# Patient Record
Sex: Female | Born: 1950 | Race: White | Hispanic: No | Marital: Married | Smoking: Never smoker
Health system: Southern US, Community
[De-identification: ages and names within clinical notes are randomized; demographics above are authoritative.]

## PROBLEM LIST (undated history)

## (undated) DIAGNOSIS — Z9889 Other specified postprocedural states: Secondary | ICD-10-CM

## (undated) DIAGNOSIS — K219 Gastro-esophageal reflux disease without esophagitis: Secondary | ICD-10-CM

## (undated) DIAGNOSIS — H269 Unspecified cataract: Secondary | ICD-10-CM

## (undated) DIAGNOSIS — M810 Age-related osteoporosis without current pathological fracture: Secondary | ICD-10-CM

## (undated) DIAGNOSIS — J00 Acute nasopharyngitis [common cold]: Secondary | ICD-10-CM

## (undated) DIAGNOSIS — R51 Headache: Secondary | ICD-10-CM

## (undated) DIAGNOSIS — T8859XA Other complications of anesthesia, initial encounter: Secondary | ICD-10-CM

## (undated) DIAGNOSIS — R112 Nausea with vomiting, unspecified: Secondary | ICD-10-CM

## (undated) DIAGNOSIS — J189 Pneumonia, unspecified organism: Secondary | ICD-10-CM

## (undated) DIAGNOSIS — M069 Rheumatoid arthritis, unspecified: Secondary | ICD-10-CM

## (undated) DIAGNOSIS — C4491 Basal cell carcinoma of skin, unspecified: Secondary | ICD-10-CM

## (undated) DIAGNOSIS — T7840XA Allergy, unspecified, initial encounter: Secondary | ICD-10-CM

## (undated) DIAGNOSIS — T4145XA Adverse effect of unspecified anesthetic, initial encounter: Secondary | ICD-10-CM

## (undated) DIAGNOSIS — R87619 Unspecified abnormal cytological findings in specimens from cervix uteri: Secondary | ICD-10-CM

## (undated) DIAGNOSIS — I10 Essential (primary) hypertension: Secondary | ICD-10-CM

## (undated) DIAGNOSIS — M1990 Unspecified osteoarthritis, unspecified site: Secondary | ICD-10-CM

## (undated) DIAGNOSIS — C801 Malignant (primary) neoplasm, unspecified: Secondary | ICD-10-CM

## (undated) HISTORY — DX: Unspecified abnormal cytological findings in specimens from cervix uteri: R87.619

## (undated) HISTORY — DX: Age-related osteoporosis without current pathological fracture: M81.0

## (undated) HISTORY — DX: Allergy, unspecified, initial encounter: T78.40XA

## (undated) HISTORY — DX: Unspecified cataract: H26.9

## (undated) HISTORY — DX: Rheumatoid arthritis, unspecified: M06.9

## (undated) HISTORY — DX: Malignant (primary) neoplasm, unspecified: C80.1

## (undated) HISTORY — PX: SKIN CANCER EXCISION: SHX779

## (undated) HISTORY — DX: Essential (primary) hypertension: I10

---
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
## 1982-10-22 HISTORY — PX: CRYOTHERAPY: SHX1416

## 1988-10-22 HISTORY — PX: DILATION AND CURETTAGE OF UTERUS: SHX78

## 1998-01-18 ENCOUNTER — Encounter (HOSPITAL_COMMUNITY): Admission: RE | Admit: 1998-01-18 | Discharge: 1998-04-18 | Payer: Self-pay

## 1998-04-27 ENCOUNTER — Encounter (HOSPITAL_COMMUNITY): Admission: RE | Admit: 1998-04-27 | Discharge: 1998-07-26 | Payer: Self-pay

## 1998-07-22 DIAGNOSIS — R87619 Unspecified abnormal cytological findings in specimens from cervix uteri: Secondary | ICD-10-CM

## 1998-07-22 HISTORY — DX: Unspecified abnormal cytological findings in specimens from cervix uteri: R87.619

## 1998-07-27 ENCOUNTER — Other Ambulatory Visit: Admission: RE | Admit: 1998-07-27 | Discharge: 1998-07-27 | Payer: Self-pay | Admitting: *Deleted

## 1998-09-07 ENCOUNTER — Other Ambulatory Visit: Admission: RE | Admit: 1998-09-07 | Discharge: 1998-09-07 | Payer: Self-pay | Admitting: *Deleted

## 1998-12-28 ENCOUNTER — Other Ambulatory Visit: Admission: RE | Admit: 1998-12-28 | Discharge: 1998-12-28 | Payer: Self-pay | Admitting: *Deleted

## 1999-08-23 ENCOUNTER — Other Ambulatory Visit: Admission: RE | Admit: 1999-08-23 | Discharge: 1999-08-23 | Payer: Self-pay | Admitting: *Deleted

## 2000-03-20 ENCOUNTER — Other Ambulatory Visit: Admission: RE | Admit: 2000-03-20 | Discharge: 2000-03-20 | Payer: Self-pay | Admitting: *Deleted

## 2000-08-26 ENCOUNTER — Other Ambulatory Visit: Admission: RE | Admit: 2000-08-26 | Discharge: 2000-08-26 | Payer: Self-pay | Admitting: *Deleted

## 2001-08-27 ENCOUNTER — Other Ambulatory Visit: Admission: RE | Admit: 2001-08-27 | Discharge: 2001-08-27 | Payer: Self-pay | Admitting: *Deleted

## 2002-08-20 ENCOUNTER — Other Ambulatory Visit: Admission: RE | Admit: 2002-08-20 | Discharge: 2002-08-20 | Payer: Self-pay | Admitting: *Deleted

## 2003-10-11 ENCOUNTER — Other Ambulatory Visit: Admission: RE | Admit: 2003-10-11 | Discharge: 2003-10-11 | Payer: Self-pay | Admitting: *Deleted

## 2004-10-12 ENCOUNTER — Other Ambulatory Visit: Admission: RE | Admit: 2004-10-12 | Discharge: 2004-10-12 | Payer: Self-pay | Admitting: *Deleted

## 2005-01-04 ENCOUNTER — Ambulatory Visit (HOSPITAL_COMMUNITY): Admission: RE | Admit: 2005-01-04 | Discharge: 2005-01-04 | Payer: Self-pay

## 2005-01-08 ENCOUNTER — Ambulatory Visit (HOSPITAL_COMMUNITY): Admission: RE | Admit: 2005-01-08 | Discharge: 2005-01-08 | Payer: Self-pay

## 2005-10-19 ENCOUNTER — Other Ambulatory Visit: Admission: RE | Admit: 2005-10-19 | Discharge: 2005-10-19 | Payer: Self-pay | Admitting: Obstetrics and Gynecology

## 2005-11-01 ENCOUNTER — Ambulatory Visit: Payer: Self-pay | Admitting: Gastroenterology

## 2005-11-28 ENCOUNTER — Ambulatory Visit: Payer: Self-pay | Admitting: Gastroenterology

## 2005-11-30 ENCOUNTER — Ambulatory Visit: Payer: Self-pay | Admitting: Gastroenterology

## 2006-11-21 ENCOUNTER — Other Ambulatory Visit: Admission: RE | Admit: 2006-11-21 | Discharge: 2006-11-21 | Payer: Self-pay | Admitting: Obstetrics and Gynecology

## 2007-12-09 ENCOUNTER — Other Ambulatory Visit: Admission: RE | Admit: 2007-12-09 | Discharge: 2007-12-09 | Payer: Self-pay | Admitting: Obstetrics and Gynecology

## 2008-12-13 ENCOUNTER — Other Ambulatory Visit: Admission: RE | Admit: 2008-12-13 | Discharge: 2008-12-13 | Payer: Self-pay | Admitting: Obstetrics and Gynecology

## 2013-10-21 NOTE — H&P (Signed)
History of Present Illness The patient is a 62 year old female who presents to the practice today for a transition into care. The patient is transitioning into care from another physician Sheran Luz, MD) .  Additional reason for visit:  Follow-up backis described as the following: The patient is being followed for their right-sided back pain. They are now 1 month(s) out. Symptoms reported today include: pain (and "I'm giving up and going ahead with surgery"), pain at night, difficulty ambulating, numbness (right leg, tingling), leg pain (right leg to foot) and pain with standing. The patient states that they are doing poorly. Current treatment includes: pain medications. The following medication has been used for pain control: none and antiinflammatory medication (Lodine bid for Rheumatology isssues). The patient reports their current pain level to be mild to moderate and 6 / 10. The patient presents today following ESI (08/24/13). The patient has reported improvement of their symptoms with: activity modification, conservative measures and Cortisone injections. The patient indicates that they have questions or concerns today regarding wants to discuss surgery , patient had second opinion with neurosurgeon , both described different procedures.    Subjective Transcription  The patient returns today for follow up. She continues to have significant back, buttock, and radicular neuropathic right leg pain. She is still getting some on the left, but the majority of her pain is right buttock and right leg pain. She has very little midline back pain. We have gone over her MRI and x rays again. Her MRI was from 03/05/13 and her x rays. She does have a slight anterior listhesis at L3-4 with some facet overgrowth and ligamentum flavum hypertrophy, but the majority of her stenosis at L4-5, especially on the right lateral recess. There is some disease at 5-1, but it is not as severe at the  4-5 level.    Allergies No Known Drug Allergies. 02/26/2013    Social History Alcohol use. current drinker; drinks wine; only occasionally per week Children. 1 Current work status. retired Financial planner (Currently). no Drug/Alcohol Rehab (Previously). no Exercise. Exercises daily; does running / walking and other Illicit drug use. no Living situation. live with spouse Marital status. married Number of flights of stairs before winded. 4-5 Pain Contract. no Tobacco use. Never smoker. never smoker    Medication History Ultram (50MG  Tablet, 1 (one) Tablet Oral four times daily, as needed, Taken starting 05/22/2013) Active. Vitamin D (Ergocalciferol) (50000UNIT Capsule, 1 Oral a week) Active. Oscal 500/200 D-3 (500-200MG -UNIT Tablet, 1 Oral two times daily) Active. Fish Oil (1000MG  Capsule, 1 Oral) Active. Methotrexate (2.5MG  Tablet, 8 Oral a week) Active. Etodolac (400MG  Tablet, 1 Oral two times daily, as needed) Active. Folic Acid (1MG  Tablet, 2 Oral daily) Active. Medications Reconciled.    Objective Transcription  At this point in time clinically she is alert and oriented times three. No shortness of breath or chest pain. The abdomen is soft and nontender. No incontinence of bowel and bladder. She has significant neuropathic leg pain on the right side. She has relief with forward flexion. Intensified pain with extension. Compartment are soft and nontender. Intact peripheral pulses throughout. No shortness of breath or chest pain. No incontinence of bowel and bladder. She has had multiple injections that have failed. She has had therapy and failed as well.  Radiographs:  She has a 7 degree curve from L2 to L4. There is no significant collapse of the disc and it is a relatively stable curve  MRI: Spinal stenosis multi-level with facet  arthrosis L3-5.  L4/5 right disc herniation vs spur causing marked lateral recess stenosis and L5  nerve compression   Assessment & Plan   Plans Transcription  At this point we have talked long and hard about fusion surgery versus decompression alone. Given the lack of any significant back pain, given the lack of any significant instability at the 4-5 level, I do not think instrumented fusion is warranted. My concern about doing a fusion at either 3-4, 4-5 or both levels is this would significant concentrate stress at the 5-1 level, which is not a good looking disc at this time. That is more likely to fail in the future and present more problems. Furthermore, the greatest amount of disease is at the 4-5 level and there is no instability at that level. There is some disease at 3-4 which can be taken care of with a laminotomy and fasciectomy. This can be done without destabilizing the 3-4 level. I have also indicated that if it were to destabilize and she were to have problems in the future, can always return and do a lateral based fusion operation. 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 The risks of that include infection, bleeding, nerve damage, death, stoke, paralysis, failure to heal, need for further surgery, ongoing or worse pain, loss of bowel and bladder control, blood clots, adjacent segment disease. All of her questions were addressed. We will plan on doing a 3 to 5 decompression. This would allow decompression of the facet arthrosis and ligamentum flavum buckling at 3-4 and the extensive stenosis at 4-5. We will also make sure we have clearance from her rheumatologist and her primary care physician and we will try to do this at some point in January after the holidays.

## 2013-10-28 ENCOUNTER — Encounter (HOSPITAL_COMMUNITY): Payer: Self-pay | Admitting: Pharmacy Technician

## 2013-11-03 NOTE — Pre-Procedure Instructions (Signed)
ZARA WENDT  11/03/2013   Your procedure is scheduled on:  Wednesday, Januray 21.  Report to [facility], Main Entrance [facility]" at 10:30 AM.  Call this number if you have problems the morning of surgery: [phone]   Remember:   Do not eat food or drink liquids after midnight Tuesday.   Take these medicines the morning of surgery with A SIP OF WATER: None.             Stop taking all Vitamins, Herbals Medications and NSAID's (Lodine).   Do not wear jewelry, make-up or nail polish.  Do not wear lotions, powders, or perfumes. You may wear deodorant.  Do not shave 48 hours prior to surgery.   Do not bring valuables to the hospital.  [facility] is not responsible for any belongings or valuables.               Contacts, dentures or bridgework may not be worn into surgery.  Leave suitcase in the car. After surgery it may be brought to your room.  For patients admitted to the hospital, discharge time is determined by your treatment team.               Patients discharged the day of surgery will not be allowed to drive home.  Name and phone number of your driver:-   Special Instructions: Shower using CHG 2 nights before surgery and the night before surgery.  If you shower the day of surgery use CHG.  Use special wash - you have one bottle of CHG for all showers.  You should use approximately 1/3 of the bottle for each shower.   Please read over the following fact sheets that you were given: Pain Booklet, Coughing and Deep Breathing and Surgical Site Infection Prevention

## 2013-11-04 ENCOUNTER — Encounter (HOSPITAL_COMMUNITY)
Admission: RE | Admit: 2013-11-04 | Discharge: 2013-11-04 | Disposition: A | Discharge status: Home / Self Care | Payer: BC Managed Care – PPO | Source: Ambulatory Visit | Attending: Orthopedic Surgery | Admitting: Orthopedic Surgery

## 2013-11-04 ENCOUNTER — Encounter (HOSPITAL_COMMUNITY): Payer: Self-pay

## 2013-11-04 DIAGNOSIS — Z01812 Encounter for preprocedural laboratory examination: Secondary | ICD-10-CM | POA: Insufficient documentation

## 2013-11-04 DIAGNOSIS — Z01818 Encounter for other preprocedural examination: Secondary | ICD-10-CM | POA: Insufficient documentation

## 2013-11-04 HISTORY — DX: Other complications of anesthesia, initial encounter: T88.59XA

## 2013-11-04 HISTORY — DX: Acute nasopharyngitis (common cold): J00

## 2013-11-04 HISTORY — DX: Headache: R51

## 2013-11-04 HISTORY — DX: Adverse effect of unspecified anesthetic, initial encounter: T41.45XA

## 2013-11-04 HISTORY — DX: Nausea with vomiting, unspecified: R11.2

## 2013-11-04 HISTORY — DX: Unspecified osteoarthritis, unspecified site: M19.90

## 2013-11-04 HISTORY — DX: Other specified postprocedural states: Z98.890

## 2013-11-04 LAB — BASIC METABOLIC PANEL
BUN: 24 mg/dL — ABNORMAL HIGH (ref 6–23)
CALCIUM: 9.8 mg/dL (ref 8.4–10.5)
CO2: 29 meq/L (ref 19–32)
CREATININE: 0.68 mg/dL (ref 0.50–1.10)
Chloride: 103 mEq/L (ref 96–112)
GFR calc Af Amer: >90 mL/min (ref >90)
GLUCOSE: 89 mg/dL (ref 70–99)
Potassium: 4.7 mEq/L (ref 3.7–5.3)
Sodium: 144 mEq/L (ref 137–147)

## 2013-11-04 LAB — SURGICAL PCR SCREEN
MRSA, PCR: NEGATIVE
STAPHYLOCOCCUS AUREUS: NEGATIVE

## 2013-11-04 LAB — CBC
HCT: 39.6 % (ref 36.0–46.0)
Hemoglobin: 13.5 g/dL (ref 12.0–15.0)
MCH: 31 pg (ref 26.0–34.0)
MCHC: 34.1 g/dL (ref 30.0–36.0)
MCV: 91 fL (ref 78.0–100.0)
PLATELETS: 165 10*3/uL (ref 150–400)
RBC: 4.35 MIL/uL (ref 3.87–5.11)
RDW: 13.4 % (ref 11.5–15.5)
WBC: 5.4 10*3/uL (ref 4.0–10.5)

## 2013-11-10 MED ORDER — CEFAZOLIN SODIUM-DEXTROSE 2-3 GM-% IV SOLR
2.0000 g | INTRAVENOUS | Status: AC
  Administered 2013-11-11: 2 g via INTRAVENOUS

## 2013-11-10 NOTE — Progress Notes (Signed)
Spoke with patient and informed her of surgery time change and to arrive at 0630 with stated understanding.

## 2013-11-11 ENCOUNTER — Encounter (HOSPITAL_COMMUNITY): Payer: BC Managed Care – PPO | Admitting: Anesthesiology

## 2013-11-11 ENCOUNTER — Encounter (HOSPITAL_COMMUNITY): Payer: Self-pay | Admitting: *Deleted

## 2013-11-11 ENCOUNTER — Inpatient Hospital Stay (HOSPITAL_COMMUNITY): Payer: BC Managed Care – PPO | Admitting: Anesthesiology

## 2013-11-11 ENCOUNTER — Inpatient Hospital Stay (HOSPITAL_COMMUNITY): Payer: BC Managed Care – PPO

## 2013-11-11 ENCOUNTER — Inpatient Hospital Stay (HOSPITAL_COMMUNITY)
Admission: RE | Admit: 2013-11-11 | Discharge: 2013-11-13 | DRG: 520 | Disposition: A | Discharge status: Home Health | Payer: BC Managed Care – PPO | Source: Ambulatory Visit | Attending: Orthopedic Surgery | Admitting: Orthopedic Surgery

## 2013-11-11 ENCOUNTER — Encounter (HOSPITAL_COMMUNITY)
Admission: RE | Disposition: A | Discharge status: Home Health | Payer: BC Managed Care – PPO | Source: Ambulatory Visit | Attending: Orthopedic Surgery

## 2013-11-11 DIAGNOSIS — Q762 Congenital spondylolisthesis: Secondary | ICD-10-CM

## 2013-11-11 DIAGNOSIS — M5126 Other intervertebral disc displacement, lumbar region: Principal | ICD-10-CM | POA: Diagnosis present

## 2013-11-11 DIAGNOSIS — M549 Dorsalgia, unspecified: Secondary | ICD-10-CM | POA: Diagnosis present

## 2013-11-11 DIAGNOSIS — Z9889 Other specified postprocedural states: Secondary | ICD-10-CM

## 2013-11-11 DIAGNOSIS — M069 Rheumatoid arthritis, unspecified: Secondary | ICD-10-CM | POA: Diagnosis present

## 2013-11-11 HISTORY — PX: DECOMPRESSIVE LUMBAR LAMINECTOMY LEVEL 2: SHX5792

## 2013-11-11 HISTORY — PX: LUMBAR LAMINECTOMY: SHX95

## 2013-11-11 SURGERY — DECOMPRESSIVE LUMBAR LAMINECTOMY LEVEL 2
Anesthesia: General

## 2013-11-11 MED ORDER — MENTHOL 3 MG MT LOZG: 1.0000 | LOZENGE | OROMUCOSAL | Status: DC | PRN

## 2013-11-11 MED ORDER — METHOCARBAMOL 500 MG PO TABS
ORAL_TABLET | ORAL | Status: AC
  Administered 2013-11-11: 500 mg via ORAL
  Filled 2013-11-11: qty 1

## 2013-11-11 MED ORDER — NEOSTIGMINE METHYLSULFATE 1 MG/ML IJ SOLN
INTRAMUSCULAR | Status: DC | PRN
  Administered 2013-11-11: 3 mg via INTRAVENOUS

## 2013-11-11 MED ORDER — PHENOL 1.4 % MT LIQD: 1.0000 | OROMUCOSAL | Status: DC | PRN

## 2013-11-11 MED ORDER — PHENYLEPHRINE 40 MCG/ML (10ML) SYRINGE FOR IV PUSH (FOR BLOOD PRESSURE SUPPORT)
PREFILLED_SYRINGE | INTRAVENOUS | Status: AC
  Filled 2013-11-11: qty 20

## 2013-11-11 MED ORDER — MIDAZOLAM HCL 2 MG/2ML IJ SOLN: 1.0000 mg | INTRAMUSCULAR | Status: DC | PRN

## 2013-11-11 MED ORDER — SODIUM CHLORIDE 0.9 % IJ SOLN: 3.0000 mL | INTRAMUSCULAR | Status: DC | PRN

## 2013-11-11 MED ORDER — PROMETHAZINE HCL 25 MG/ML IJ SOLN: 6.2500 mg | INTRAMUSCULAR | Status: DC | PRN

## 2013-11-11 MED ORDER — HYDROMORPHONE HCL PF 1 MG/ML IJ SOLN
INTRAMUSCULAR | Status: AC
  Administered 2013-11-11: 0.5 mg via INTRAVENOUS
  Filled 2013-11-11: qty 1

## 2013-11-11 MED ORDER — LIDOCAINE HCL (CARDIAC) 20 MG/ML IV SOLN
INTRAVENOUS | Status: DC | PRN
  Administered 2013-11-11: 80 mg via INTRAVENOUS

## 2013-11-11 MED ORDER — PHENYLEPHRINE HCL 10 MG/ML IJ SOLN
INTRAMUSCULAR | Status: AC
  Filled 2013-11-11: qty 1

## 2013-11-11 MED ORDER — ARTIFICIAL TEARS OP OINT
TOPICAL_OINTMENT | OPHTHALMIC | Status: AC
  Filled 2013-11-11: qty 3.5

## 2013-11-11 MED ORDER — OXYCODONE HCL 5 MG PO TABS
5.0000 mg | ORAL_TABLET | ORAL | Status: DC | PRN
Start: 2013-11-11 — End: 2013-11-13

## 2013-11-11 MED ORDER — PHENYLEPHRINE HCL 10 MG/ML IJ SOLN
10.0000 mg | INTRAMUSCULAR | Status: DC | PRN
  Administered 2013-11-11: 40 ug/min via INTRAVENOUS

## 2013-11-11 MED ORDER — ACETAMINOPHEN 10 MG/ML IV SOLN
1000.0000 mg | Freq: Four times a day (QID) | INTRAVENOUS | Status: DC
  Administered 2013-11-11: 1000 mg via INTRAVENOUS
  Filled 2013-11-11: qty 100

## 2013-11-11 MED ORDER — DEXAMETHASONE SODIUM PHOSPHATE 10 MG/ML IJ SOLN
INTRAMUSCULAR | Status: DC | PRN
  Administered 2013-11-11: 10 mg via INTRAVENOUS

## 2013-11-11 MED ORDER — PHENYLEPHRINE HCL 10 MG/ML IJ SOLN
INTRAMUSCULAR | Status: DC | PRN
  Administered 2013-11-11 (×4): 80 ug via INTRAVENOUS

## 2013-11-11 MED ORDER — TRAMADOL HCL 50 MG PO TABS
50.0000 mg | ORAL_TABLET | Freq: Four times a day (QID) | ORAL | Status: DC | PRN
  Administered 2013-11-12 – 2013-11-13 (×5): 50 mg via ORAL
  Filled 2013-11-11 (×5): qty 1

## 2013-11-11 MED ORDER — THROMBIN 20000 UNITS EX SOLR
CUTANEOUS | Status: AC
  Filled 2013-11-11: qty 20000

## 2013-11-11 MED ORDER — MORPHINE SULFATE 2 MG/ML IJ SOLN
INTRAMUSCULAR | Status: AC
  Filled 2013-11-11: qty 1

## 2013-11-11 MED ORDER — ONDANSETRON HCL 4 MG/2ML IJ SOLN
INTRAMUSCULAR | Status: DC | PRN
Start: 2013-11-11 — End: 2013-11-11
  Administered 2013-11-11: 4 mg via INTRAVENOUS

## 2013-11-11 MED ORDER — DEXAMETHASONE SODIUM PHOSPHATE 10 MG/ML IJ SOLN
INTRAMUSCULAR | Status: AC
  Filled 2013-11-11: qty 1

## 2013-11-11 MED ORDER — BUPIVACAINE-EPINEPHRINE PF 0.25-1:200000 % IJ SOLN
INTRAMUSCULAR | Status: DC | PRN
  Administered 2013-11-11: 30 mL via PERINEURAL

## 2013-11-11 MED ORDER — FENTANYL CITRATE 0.05 MG/ML IJ SOLN
INTRAMUSCULAR | Status: AC
  Filled 2013-11-11: qty 5

## 2013-11-11 MED ORDER — NEOSTIGMINE METHYLSULFATE 1 MG/ML IJ SOLN
INTRAMUSCULAR | Status: AC
  Filled 2013-11-11: qty 10

## 2013-11-11 MED ORDER — EPHEDRINE SULFATE 50 MG/ML IJ SOLN
INTRAMUSCULAR | Status: AC
  Filled 2013-11-11: qty 1

## 2013-11-11 MED ORDER — FENTANYL CITRATE 0.05 MG/ML IJ SOLN
INTRAMUSCULAR | Status: DC | PRN
  Administered 2013-11-11: 50 ug via INTRAVENOUS
  Administered 2013-11-11: 100 ug via INTRAVENOUS

## 2013-11-11 MED ORDER — ONDANSETRON HCL 4 MG/2ML IJ SOLN
4.0000 mg | INTRAMUSCULAR | Status: DC | PRN
  Administered 2013-11-11 (×2): 4 mg via INTRAVENOUS
  Filled 2013-11-11 (×2): qty 2

## 2013-11-11 MED ORDER — ROCURONIUM BROMIDE 50 MG/5ML IV SOLN
INTRAVENOUS | Status: AC
  Filled 2013-11-11: qty 1

## 2013-11-11 MED ORDER — OXYCODONE HCL 5 MG PO TABS
ORAL_TABLET | ORAL | Status: AC
  Administered 2013-11-11: 5 mg via ORAL
  Filled 2013-11-11: qty 1

## 2013-11-11 MED ORDER — SODIUM CHLORIDE 0.9 % IJ SOLN
3.0000 mL | Freq: Two times a day (BID) | INTRAMUSCULAR | Status: DC
  Administered 2013-11-12: 3 mL via INTRAVENOUS

## 2013-11-11 MED ORDER — MORPHINE SULFATE 2 MG/ML IJ SOLN
1.0000 mg | INTRAMUSCULAR | Status: DC | PRN
  Administered 2013-11-11 – 2013-11-12 (×5): 2 mg via INTRAVENOUS
  Filled 2013-11-11 (×4): qty 1

## 2013-11-11 MED ORDER — STERILE WATER FOR INJECTION IJ SOLN
INTRAMUSCULAR | Status: AC
  Filled 2013-11-11: qty 10

## 2013-11-11 MED ORDER — ONDANSETRON HCL 4 MG/2ML IJ SOLN
INTRAMUSCULAR | Status: AC
  Filled 2013-11-11: qty 2

## 2013-11-11 MED ORDER — OXYCODONE HCL 5 MG/5ML PO SOLN: 5.0000 mg | Freq: Once | ORAL | Status: AC | PRN

## 2013-11-11 MED ORDER — PROPOFOL 10 MG/ML IV BOLUS
INTRAVENOUS | Status: AC
  Filled 2013-11-11: qty 20

## 2013-11-11 MED ORDER — LACTATED RINGERS IV SOLN
INTRAVENOUS | Status: DC | PRN
  Administered 2013-11-11 (×2): via INTRAVENOUS

## 2013-11-11 MED ORDER — SODIUM CHLORIDE 0.9 % IV SOLN: 250.0000 mL | INTRAVENOUS | Status: DC

## 2013-11-11 MED ORDER — DEXAMETHASONE 4 MG PO TABS
4.0000 mg | ORAL_TABLET | Freq: Four times a day (QID) | ORAL | Status: DC
  Administered 2013-11-12 – 2013-11-13 (×5): 4 mg via ORAL
  Filled 2013-11-11 (×11): qty 1

## 2013-11-11 MED ORDER — FENTANYL CITRATE 0.05 MG/ML IJ SOLN: 50.0000 ug | Freq: Once | INTRAMUSCULAR | Status: DC

## 2013-11-11 MED ORDER — DEXAMETHASONE SODIUM PHOSPHATE 4 MG/ML IJ SOLN
4.0000 mg | Freq: Four times a day (QID) | INTRAMUSCULAR | Status: DC
  Administered 2013-11-11 – 2013-11-12 (×3): 4 mg via INTRAVENOUS
  Filled 2013-11-11 (×11): qty 1

## 2013-11-11 MED ORDER — VECURONIUM BROMIDE 10 MG IV SOLR
INTRAVENOUS | Status: AC
  Filled 2013-11-11: qty 10

## 2013-11-11 MED ORDER — PROPOFOL 10 MG/ML IV BOLUS
INTRAVENOUS | Status: DC | PRN
  Administered 2013-11-11: 150 mg via INTRAVENOUS

## 2013-11-11 MED ORDER — GLYCOPYRROLATE 0.2 MG/ML IJ SOLN
INTRAMUSCULAR | Status: AC
  Filled 2013-11-11: qty 1

## 2013-11-11 MED ORDER — OXYCODONE HCL 5 MG PO TABS
5.0000 mg | ORAL_TABLET | Freq: Once | ORAL | Status: AC | PRN
  Administered 2013-11-11: 5 mg via ORAL

## 2013-11-11 MED ORDER — MIDAZOLAM HCL 5 MG/5ML IJ SOLN
INTRAMUSCULAR | Status: DC | PRN
  Administered 2013-11-11 (×2): 1 mg via INTRAVENOUS

## 2013-11-11 MED ORDER — GLYCOPYRROLATE 0.2 MG/ML IJ SOLN
INTRAMUSCULAR | Status: DC | PRN
  Administered 2013-11-11: 0.4 mg via INTRAVENOUS

## 2013-11-11 MED ORDER — GLYCOPYRROLATE 0.2 MG/ML IJ SOLN
INTRAMUSCULAR | Status: AC
  Filled 2013-11-11: qty 2

## 2013-11-11 MED ORDER — LIDOCAINE HCL (CARDIAC) 20 MG/ML IV SOLN
INTRAVENOUS | Status: AC
  Filled 2013-11-11: qty 5

## 2013-11-11 MED ORDER — ARTIFICIAL TEARS OP OINT
TOPICAL_OINTMENT | OPHTHALMIC | Status: DC | PRN
  Administered 2013-11-11: 1 via OPHTHALMIC

## 2013-11-11 MED ORDER — SODIUM CHLORIDE 0.9 % IJ SOLN
INTRAMUSCULAR | Status: AC
  Filled 2013-11-11: qty 10

## 2013-11-11 MED ORDER — VECURONIUM BROMIDE 10 MG IV SOLR
INTRAVENOUS | Status: DC | PRN
  Administered 2013-11-11: 2 mg via INTRAVENOUS

## 2013-11-11 MED ORDER — DOCUSATE SODIUM 100 MG PO CAPS
100.0000 mg | ORAL_CAPSULE | Freq: Two times a day (BID) | ORAL | Status: DC
  Administered 2013-11-12 – 2013-11-13 (×3): 100 mg via ORAL
  Filled 2013-11-11 (×5): qty 1

## 2013-11-11 MED ORDER — HYDROMORPHONE HCL PF 1 MG/ML IJ SOLN
0.2500 mg | INTRAMUSCULAR | Status: DC | PRN
  Administered 2013-11-11 (×4): 0.5 mg via INTRAVENOUS

## 2013-11-11 MED ORDER — SUCCINYLCHOLINE CHLORIDE 20 MG/ML IJ SOLN
INTRAMUSCULAR | Status: AC
  Filled 2013-11-11: qty 1

## 2013-11-11 MED ORDER — LACTATED RINGERS IV SOLN
INTRAVENOUS | Status: DC
  Administered 2013-11-11 – 2013-11-12 (×2): via INTRAVENOUS

## 2013-11-11 MED ORDER — BUPIVACAINE-EPINEPHRINE (PF) 0.25% -1:200000 IJ SOLN
INTRAMUSCULAR | Status: AC
  Filled 2013-11-11: qty 30

## 2013-11-11 MED ORDER — CEFAZOLIN SODIUM 1-5 GM-% IV SOLN
1.0000 g | Freq: Three times a day (TID) | INTRAVENOUS | Status: AC
  Administered 2013-11-11 – 2013-11-12 (×2): 1 g via INTRAVENOUS
  Filled 2013-11-11 (×3): qty 50

## 2013-11-11 MED ORDER — METHOCARBAMOL 100 MG/ML IJ SOLN
500.0000 mg | Freq: Four times a day (QID) | INTRAVENOUS | Status: DC | PRN
  Filled 2013-11-11: qty 5

## 2013-11-11 MED ORDER — METHOCARBAMOL 500 MG PO TABS
500.0000 mg | ORAL_TABLET | Freq: Four times a day (QID) | ORAL | Status: DC | PRN
  Administered 2013-11-11 – 2013-11-13 (×6): 500 mg via ORAL
  Filled 2013-11-11 (×5): qty 1

## 2013-11-11 MED ORDER — ROCURONIUM BROMIDE 100 MG/10ML IV SOLN
INTRAVENOUS | Status: DC | PRN
  Administered 2013-11-11: 50 mg via INTRAVENOUS

## 2013-11-11 MED ORDER — ACETAMINOPHEN 10 MG/ML IV SOLN
1000.0000 mg | Freq: Four times a day (QID) | INTRAVENOUS | Status: AC
  Administered 2013-11-11 – 2013-11-12 (×4): 1000 mg via INTRAVENOUS
  Filled 2013-11-11 (×4): qty 100

## 2013-11-11 MED ORDER — MIDAZOLAM HCL 2 MG/2ML IJ SOLN
INTRAMUSCULAR | Status: AC
  Filled 2013-11-11: qty 2

## 2013-11-11 SURGICAL SUPPLY — 56 items
APL SKNCLS STERI-STRIP NONHPOA (GAUZE/BANDAGES/DRESSINGS) ×1
BANDAGE GAUZE ELAST BULKY 4 IN (GAUZE/BANDAGES/DRESSINGS) ×2 IMPLANT
BENZOIN TINCTURE PRP APPL 2/3 (GAUZE/BANDAGES/DRESSINGS) ×1 IMPLANT
BUR EGG ELITE 4.0 (BURR) ×1 IMPLANT
CLOSURE STERI-STRIP 1/4X4 (GAUZE/BANDAGES/DRESSINGS) ×1 IMPLANT
CLOTH BEACON ORANGE TIMEOUT ST (SAFETY) ×2 IMPLANT
CORDS BIPOLAR (ELECTRODE) ×2 IMPLANT
DRAPE C-ARM 42X72 X-RAY (DRAPES) ×2 IMPLANT
DRAPE POUCH INSTRU U-SHP 10X18 (DRAPES) ×2 IMPLANT
DRAPE SURG 17X11 SM STRL (DRAPES) ×2 IMPLANT
DRAPE U-SHAPE 47X51 STRL (DRAPES) ×2 IMPLANT
DRSG MEPILEX BORDER 4X4 (GAUZE/BANDAGES/DRESSINGS) ×2 IMPLANT
DRSG MEPILEX BORDER 4X8 (GAUZE/BANDAGES/DRESSINGS) ×1 IMPLANT
DURAPREP 26ML APPLICATOR (WOUND CARE) ×2 IMPLANT
ELECT BLADE 4.0 EZ CLEAN MEGAD (MISCELLANEOUS) ×2
ELECT CAUTERY BLADE 6.4 (BLADE) ×2 IMPLANT
ELECT REM PT RETURN 9FT ADLT (ELECTROSURGICAL) ×2
ELECTRODE BLDE 4.0 EZ CLN MEGD (MISCELLANEOUS) ×1 IMPLANT
ELECTRODE REM PT RTRN 9FT ADLT (ELECTROSURGICAL) ×1 IMPLANT
FLOSEAL (HEMOSTASIS) IMPLANT
GLOVE BIOGEL PI IND STRL 8 (GLOVE) ×1 IMPLANT
GLOVE BIOGEL PI IND STRL 8.5 (GLOVE) ×1 IMPLANT
GLOVE BIOGEL PI INDICATOR 8 (GLOVE) ×1
GLOVE BIOGEL PI INDICATOR 8.5 (GLOVE) ×1
GLOVE ECLIPSE 8.5 STRL (GLOVE) ×2 IMPLANT
GLOVE ORTHO TXT STRL SZ7.5 (GLOVE) ×2 IMPLANT
GOWN STRL NON-REIN LRG LVL3 (GOWN DISPOSABLE) ×2 IMPLANT
GOWN STRL REIN 2XL XLG LVL4 (GOWN DISPOSABLE) ×2 IMPLANT
GOWN STRL REUS W/TWL 2XL LVL3 (GOWN DISPOSABLE) ×2 IMPLANT
KIT BASIN OR (CUSTOM PROCEDURE TRAY) ×2 IMPLANT
NDL SPNL 18GX3.5 QUINCKE PK (NEEDLE) ×2 IMPLANT
NEEDLE 22X1 1/2 (OR ONLY) (NEEDLE) ×2 IMPLANT
NEEDLE SPNL 18GX3.5 QUINCKE PK (NEEDLE) ×4 IMPLANT
NS IRRIG 1000ML POUR BTL (IV SOLUTION) ×2 IMPLANT
PACK LAMINECTOMY ORTHO (CUSTOM PROCEDURE TRAY) ×2 IMPLANT
PACK UNIVERSAL I (CUSTOM PROCEDURE TRAY) ×2 IMPLANT
PATTIES SURGICAL .5 X.5 (GAUZE/BANDAGES/DRESSINGS) IMPLANT
PATTIES SURGICAL .5 X1 (DISPOSABLE) ×2 IMPLANT
SPONGE LAP 4X18 X RAY DECT (DISPOSABLE) IMPLANT
SPONGE SURGIFOAM ABS GEL 100 (HEMOSTASIS) ×2 IMPLANT
STAPLER VISISTAT 35W (STAPLE) IMPLANT
STRIP CLOSURE SKIN 1/2X4 (GAUZE/BANDAGES/DRESSINGS) IMPLANT
SURGIFLO TRUKIT (HEMOSTASIS) ×2 IMPLANT
SUT MON AB 3-0 SH 27 (SUTURE) ×2
SUT MON AB 3-0 SH27 (SUTURE) ×1 IMPLANT
SUT VIC AB 1 CT1 27 (SUTURE) ×4
SUT VIC AB 1 CT1 27XBRD ANTBC (SUTURE) ×2 IMPLANT
SUT VIC AB 2-0 CT1 18 (SUTURE) ×4 IMPLANT
SUT VICRYL 0 UR6 27IN ABS (SUTURE) ×2 IMPLANT
SYR BULB IRRIGATION 50ML (SYRINGE) ×2 IMPLANT
SYR CONTROL 10ML LL (SYRINGE) ×4 IMPLANT
TOWEL OR 17X26 10 PK STRL BLUE (TOWEL DISPOSABLE) ×4 IMPLANT
TRAY FOLEY CATH 16FRSI W/METER (SET/KITS/TRAYS/PACK) IMPLANT
TRAY FOLEY METER SIL LF 16FR (CATHETERS) ×1 IMPLANT
WATER STERILE IRR 1000ML POUR (IV SOLUTION) ×2 IMPLANT
YANKAUER SUCT BULB TIP NO VENT (SUCTIONS) ×2 IMPLANT

## 2013-11-11 NOTE — Anesthesia Preprocedure Evaluation (Addendum)
Anesthesia Evaluation  Patient identified by MRN, date of birth, ID band Patient awake    Reviewed: Allergy & Precautions, H&P , NPO status , Patient's Chart, lab work & pertinent test results  History of Anesthesia Complications (+) PONV  Airway Mallampati: II TM Distance: >3 FB Neck ROM: Full    Dental  (+) Teeth Intact and Dental Advisory Given   Pulmonary neg pulmonary ROS,  breath sounds clear to auscultation        Cardiovascular negative cardio ROS  Rhythm:Regular Rate:Normal     Neuro/Psych  Headaches, negative psych ROS   GI/Hepatic negative GI ROS, Neg liver ROS,   Endo/Other  negative endocrine ROS  Renal/GU negative Renal ROS     Musculoskeletal  (+) Arthritis -, Rheumatoid disorders,    Abdominal   Peds  Hematology negative hematology ROS (+)   Anesthesia Other Findings   Reproductive/Obstetrics negative OB ROS                          Anesthesia Physical Anesthesia Plan  ASA: II  Anesthesia Plan: General   Post-op Pain Management:    Induction: Intravenous  Airway Management Planned: Oral ETT  Additional Equipment:   Intra-op Plan:   Post-operative Plan: Extubation in OR  Informed Consent: I have reviewed the patients History and Physical, chart, labs and discussed the procedure including the risks, benefits and alternatives for the proposed anesthesia with the patient or authorized representative who has indicated his/her understanding and acceptance.     Plan Discussed with: CRNA and Surgeon  Anesthesia Plan Comments:         Anesthesia Quick Evaluation

## 2013-11-11 NOTE — Transfer of Care (Signed)
Immediate Anesthesia Transfer of Care Note  Patient: Jacqueline Tucker  Procedure(s) Performed: Procedure(s): DECOMPRESSIVE LUMBAR LAMINECTOMY L3-L5 (N/A)  Patient Location: PACU  Anesthesia Type:General  Level of Consciousness: awake, oriented and patient cooperative  Airway & Oxygen Therapy: Patient Spontanous Breathing and Patient connected to nasal cannula oxygen  Post-op Assessment: Report given to PACU RN and Post -op Vital signs reviewed and stable  Post vital signs: Reviewed  Complications: No apparent anesthesia complications

## 2013-11-11 NOTE — Anesthesia Postprocedure Evaluation (Signed)
  Anesthesia Post-op Note  Patient: Jacqueline Tucker  Procedure(s) Performed: Procedure(s): DECOMPRESSIVE LUMBAR LAMINECTOMY L3-L5 (N/A)  Patient Location: PACU  Anesthesia Type:General  Level of Consciousness: awake and alert   Airway and Oxygen Therapy: Patient Spontanous Breathing  Post-op Pain: mild  Post-op Assessment: Post-op Vital signs reviewed, Patient's Cardiovascular Status Stable, Respiratory Function Stable, Patent Airway, No signs of Nausea or vomiting and Pain level controlled  Post-op Vital Signs: Reviewed and stable  Complications: No apparent anesthesia complications

## 2013-11-11 NOTE — H&P (Signed)
No change in clinical exam Right leg pain > left Dx: Spinal stenosis  Plan on lumbar decompression H+P reviewed

## 2013-11-11 NOTE — Anesthesia Procedure Notes (Signed)
Procedure Name: Intubation Date/Time: 11/11/2013 8:37 AM Performed by: Jenne Campus Pre-anesthesia Checklist: Patient identified, Emergency Drugs available, Suction available, Patient being monitored and Timeout performed Patient Re-evaluated:Patient Re-evaluated prior to inductionOxygen Delivery Method: Circle system utilized Preoxygenation: Pre-oxygenation with 100% oxygen Intubation Type: IV induction Ventilation: Mask ventilation without difficulty and Oral airway inserted - appropriate to patient size Laryngoscope Size: Miller and 2 Grade View: Grade II Tube type: Oral Tube size: 7.0 mm Number of attempts: 1 Airway Equipment and Method: Stylet Placement Confirmation: ETT inserted through vocal cords under direct vision,  positive ETCO2,  CO2 detector and breath sounds checked- equal and bilateral Secured at: 22 cm Tube secured with: Tape Dental Injury: Teeth and Oropharynx as per pre-operative assessment

## 2013-11-11 NOTE — Brief Op Note (Signed)
11/11/2013  11:13 AM  PATIENT:  Jacqueline Tucker  63 y.o. female  PRE-OPERATIVE DIAGNOSIS:  SPINAL STENOSIS L3-5  POST-OPERATIVE DIAGNOSIS:  SPINAL STENOSIS L3-5  PROCEDURE:  Procedure(s): DECOMPRESSIVE LUMBAR LAMINECTOMY L3-L5 (N/A)  SURGEON:  Surgeon(s) and Role:    * Melina Schools, MD - Primary  PHYSICIAN ASSISTANT:   ASSISTANTS: Benjiman Core   ANESTHESIA:   general  EBL:  Total I/O In: 1800 [I.V.:1800] Out: 350 [Urine:150; Blood:200]  BLOOD ADMINISTERED:none  DRAINS: Penrose drain in the back   LOCAL MEDICATIONS USED:  MARCAINE     SPECIMEN:  No Specimen  DISPOSITION OF SPECIMEN:  N/A  COUNTS:  YES  TOURNIQUET:  * No tourniquets in log *  DICTATION: .Other Dictation: Dictation Number [id number]  PLAN OF CARE: Admit to inpatient   PATIENT DISPOSITION:  PACU - hemodynamically stable.

## 2013-11-11 NOTE — Preoperative (Signed)
Beta Blockers   Reason not to administer Beta Blockers:Not Applicable 

## 2013-11-11 NOTE — Progress Notes (Signed)
Utilization review completed.  

## 2013-11-12 ENCOUNTER — Encounter (HOSPITAL_COMMUNITY): Payer: Self-pay | Admitting: General Practice

## 2013-11-12 MED FILL — Thrombin For Soln 20000 Unit: CUTANEOUS | Qty: 1 | Status: AC

## 2013-11-12 NOTE — Op Note (Signed)
NAMENANCY, ARVIN NO.:  [id number]  MEDICAL RECORD NO.:  [id number]  LOCATION:  5N22C                        FACILITY:  [facility]  PHYSICIAN:  Dahlia Bailiff, MD    DATE OF BIRTH:  [birth year]  DATE OF PROCEDURE:  11/11/2013 DATE OF DISCHARGE:                              OPERATIVE REPORT   PREOPERATIVE DIAGNOSES:  Lumbar spinal stenosis, L3-4 and L4-5.  Lumbar spondylolisthesis, L3-4.  Lumbar disk herniation, L4-5 posterolateral to the right.  POSTOPERATIVE DIAGNOSES:  Lumbar spinal stenosis, L3-4 and L4-5. Lumbar spondylolisthesis, L3-4.  Lumbar disk herniation, L4-5 posterolateral to the right.  OPERATIVE PROCEDURES:  Lumbar central decompression, laminectomy L5 and L4, laminotomy L3 with foraminotomy and facetectomy L3-4 and L4-5, and diskectomy L4-5, right side.  COMPLICATIONS:  None.  CONDITION:  Stable.  FIRST ASSISTANT:  Alyson Locket. Velora Heckler.  HISTORY:  This is a very pleasant elderly woman who has been having severe back, buttock and right leg pain.  She presented to me with ongoing severe symptoms.  Despite appropriate conservative management and her symptoms continued to progress as a result of the failure to improve with conservative measures, we elected to proceed with surgery. All appropriate risks, benefits, and alternatives were discussed with the patient and consent was obtained.  OPERATIVE NOTE:  The patient was brought to the operating room and placed supine on the operating table.  After successful induction of general anesthesia and endotracheal intubation, TEDs, SCDs, and a Foley were inserted.  The patient was turned prone onto the Wilson frame and all bony prominences were well padded.  The back was prepped and draped in standard fashion.  Time-out was taken to confirm the patient, procedure, and all other pertinent important data.  Two needles were placed in the back and x-ray was taken and map out the incision site. The  incision site was then infiltrated with 0.25% Marcaine, and a midline incision was made exposing the 3, 4 and 5 spinous process. Sharp dissection was carried out down to the deep fascia.  Using Cobb elevator and Bovie, I stripped the paraspinal muscles to the level of the facet joints.  Care was taken not to violate the facet capsules themselves.  I then placed markers into the wound, took an x-ray and confirmed the 4, 5 and 3-4 level.  Once this was done, I used a double- action Leksell rongeur to remove the spinous process of L5, L4, and a portion of that of L3.  I then used a 3-0 Kerrison curette to develop a plane underneath the lamina of L5, then used a 3-mm Kerrison and double- action Leksell rongeur to remove the bulk of the lamina of L5.  I then developed a plane between the ligamentum flavum in the thecal sac and used a 3-mm Kerrison to perform a central decompression at L5.  I then carried the central decompression superiorly until I completed the laminectomy.  I then went into the lateral gutter on the right-hand side and then continue to remove the thickened ligamentum flavum and osteophyte from the facet complex.  I then identified the L5 nerve root and traced it into the foramen.  I then palpated the  L5 pedicle and made sure my decompression on that right side was out laterally to the medial wall of the pedicle.  Once this was done, I continued my dissection superiorly.  At L4-5, there was severe stenosis.  There was buckled ligamentum flavum, significant osteophyte formation.  This was all resected using 2-mm and 3-mm Kerrison rongeurs.  Care was taken to first develop a plane between the material in the thecal sac.  So, it was so as not to cause the tear to the thecal sac.  Once I had decompression done, I continued to complete the L4 laminectomy.  I then went and did a partial laminotomy of L3, and again went down to the lateral recess.  I then palpated the L3 pedicle and  then decompressed out to the medial wall of this.  I then identified the L3 nerve root and made sure it was decompressed in the right foramen.  At this point, I was now above and below the area of severe stenosis at the L4-5 lateral recess.  I then worked both directions using the 2-mm and 3-mm Kerrison to remove all the osteophytes from that level.  Once I had done this, I was able to visualize the quite significant enlarged disk herniation in the posterolateral gutter.  At this point, I decompressed out to the level of the medial wall of the pedicle to stay down at the other levels.  I then went on the contralateral side and did a decompression going to the left, but just not as extensive as it was on the right.  I removed the obvious areas of stenosis and thickened ligamentum flavum.  At this point, I had an adequate central and lateral recess decompression from 3- 5.  I then went down to the L4-5 disk space on the right side and mobilized the thecal sac, so I can completely exposed the disk.  I then incised the disk and using nerve hook and micropituitary rongeurs, removed three large fragments of disk material.  At this point, I was quite pleased with the decompression and diskectomy.  The wound was irrigated copiously with normal saline and used bipolar electrocautery to obtain hemostasis.  I then placed a thrombin-soaked Gelfoam patty over the exposed thecal sac.  I then placed a deep drain and closed the deep fascia with interrupted #1 Vicryl sutures.  Then, a layer of 2-0 Vicryl interrupted sutures and then a 3-0 Monocryl for the skin.  Steri-Strips and dry dressing were then applied.  At the end of the case, all needle and sponge counts were correct.  The patient was extubated, transferred to the PACU without incident.  First assistant was Benjiman Core, he was instrumental in irrigation, suction, visualization, retraction and wound closure.     Dahlia Bailiff,  MD     DDB/MEDQ  D:  11/11/2013  T:  11/12/2013  Job:  [id number]

## 2013-11-12 NOTE — Progress Notes (Signed)
This morning noticed that JP drain is not keeping charge.  Reinforced dressing; output from drain 40 mL.

## 2013-11-12 NOTE — Care Management Note (Signed)
CARE MANAGEMENT NOTE 11/12/2013  Patient:  Jacqueline Tucker, Jacqueline Tucker   Account Number:  [id number]  Date Initiated:  11/12/2013  Documentation initiated by:  Ricki Miller  Subjective/Objective Assessment:   63 yr old female s/p L3-5 laminectomy decompression     Action/Plan:   Case Manager spoke with patient concerning home health and DME needs at discharge. Choice offered. Patient has support at discharge.   Anticipated DC Date:  11/13/2013   Anticipated DC Plan:  [place]  CM consult      PAC Choice  [place]   Choice offered to / List presented to:  C-1 Patient   DME arranged  3-N-1  [facility]assie Moselle      DME agency  [place] arranged  [place].   Status of service:  Completed, signed off Medicare Important Message given?   (If response is "NO", the following Medicare IM given date fields will be blank) Date Medicare IM given:   Date Additional Medicare IM given:    Discharge Disposition:  [place]

## 2013-11-12 NOTE — Evaluation (Signed)
Occupational Therapy Evaluation Patient Details Name: Jacqueline Tucker MRN: [id number] DOB: [birth year] Today's Date: 11/12/2013 Time: 2549-8264 OT Time Calculation (min): 28 min  OT Assessment / Plan / Recommendation History of present illness Pt is a 63 y/o female admitted s/p decompressive lumbar laminectomy L3-5.    Clinical Impression   Pt demos decline in function with ADLs and ADL mobility safety and would benefit from acute OT services to address impairments to help restore PLOF to return home safely    OT Assessment  Patient needs continued OT Services    Follow Up Recommendations  No OT follow up;Supervision - Intermittent    Barriers to Discharge   none, husband will assist 24/7  Equipment Recommendations  Other (comment) (ADL A/E kit)    Recommendations for Other Services    Frequency  Min 2X/week    Precautions / Restrictions Precautions Precautions: Fall;Back Precaution Comments:  Pt able to recall 3/3 precautions at end of session.  Required Braces or Orthoses: Spinal Brace Spinal Brace: Lumbar corset Restrictions Weight Bearing Restrictions: No   Pertinent Vitals/Pain 2/10 back pain    ADL  Grooming: Performed;Wash/dry hands;Wash/dry face;Supervision/safety;Min guard Where Assessed - Grooming: Unsupported standing Upper Body Bathing: Simulated;Supervision/safety;Set up Where Assessed - Upper Body Bathing: Unsupported sitting Lower Body Bathing: Performed;Moderate assistance Upper Body Dressing: Performed;Supervision/safety;Set up Where Assessed - Upper Body Dressing: Unsupported sitting Lower Body Dressing: Performed;Moderate assistance Toilet Transfer: Performed;Min Psychiatric nurse Method: Sit to Loss adjuster, chartered: Regular height toilet;Grab bars Toileting - Clothing Manipulation and Hygiene: Performed;Minimal assistance Where Assessed - Best boy and Hygiene: Standing Tub/Shower Transfer: Performed Electrical engineer Method: Therapist, art: Grab bars;Walk in shower;Shower seat without back Equipment Used: Gait belt;Rolling walker Transfers/Ambulation Related to ADLs: VC's for hand placement on seated surface, as well as to maintain back precautions while performing transfers.  ADL Comments: Pt provided with eduction and demo of ADL A/E kit. Educated on toileting aid    OT Diagnosis: Acute pain  OT Problem List: Decreased knowledge of use of DME or AE;Decreased activity tolerance;Pain OT Treatment Interventions: Self-care/ADL training;Therapeutic exercise;Patient/family education;Neuromuscular education;Balance training;Therapeutic activities;DME and/or AE instruction   OT Goals(Current goals can be found in the care plan section) Acute Rehab OT Goals Patient Stated Goal: To return home  OT Goal Formulation: With patient Time For Goal Achievement: 11/19/13 Potential to Achieve Goals: Good ADL Goals Pt Will Perform Grooming: with set-up;with supervision;standing Pt Will Perform Lower Body Bathing: with min assist;with adaptive equipment;sit to/from stand Pt Will Perform Lower Body Dressing: with min assist;with adaptive equipment;sit to/from stand Pt Will Transfer to Toilet: with supervision;with modified independence;ambulating;regular height toilet;grab bars Pt Will Perform Toileting - Clothing Manipulation and hygiene: with min guard assist;with supervision;sit to/from stand Pt Will Perform Tub/Shower Transfer: with supervision;with modified independence;shower seat  Visit Information  Last OT Received On: 11/12/13 Assistance Needed: +1 History of Present Illness: Pt is a 63 y/o female admitted s/p decompressive lumbar laminectomy L3-5.        Prior [place] expects to be discharged to:: Private residence Living Arrangements: Spouse/significant other Available Help at Discharge: Friend(s);Available 24 hours/day Type of  Home: House Home Access: Stairs to enter [employer] of Steps: 3 Entrance Stairs-Rails: Right Home Layout: Two level Home Equipment: Walker - 4 wheels Prior Function Level of Independence: Independent Comments: Working part time, driving Communication Communication: No difficulties Dominant Hand: Right         Vision/Perception Vision -  History Baseline Vision: Wears glasses all the time Patient Visual Report: No change from baseline Perception Perception: Within Functional Limits   Cognition  Cognition Arousal/Alertness: Awake/alert Behavior During Therapy: WFL for tasks assessed/performed Overall Cognitive Status: Within Functional Limits for tasks assessed    Extremity/Trunk Assessment Upper Extremity Assessment Upper Extremity Assessment: Overall WFL for tasks assessed Lower Extremity Assessment Lower Extremity Assessment: Defer to PT evaluation Cervical / Trunk Assessment Cervical / Trunk Assessment: Normal     Mobility Bed Mobility Overal bed mobility: Needs Assistance Bed Mobility: Supine to Sit;Sit to Supine Supine to sit: Modified independent (Device/Increase time) Sit to supine: Min guard General bed mobility comments: assist with LEs back onto bed Transfers Overall transfer level: Needs assistance Equipment used: Rolling walker (2 wheeled) Transfers: Sit to/from Stand Sit to Stand: Min guard General transfer comment: VC's for hand placement on seated surface, as well as to maintain back precautions while performing transfers.      Exercise     Balance Balance Overall balance assessment: No apparent balance deficits (not formally assessed)   End of Session OT - End of Session Equipment Utilized During Treatment: Rolling walker Activity Tolerance: Patient tolerated treatment well Patient left: in bed;with call bell/phone within reach;with family/visitor present  GO     Britt Bottom 11/12/2013, 4:05 PM

## 2013-11-12 NOTE — Evaluation (Signed)
Physical Therapy Evaluation Patient Details Name: Jacqueline Tucker MRN: [id number] DOB: 09/16/1951 Today's Date: 11/12/2013 Time: 8938-1017 PT Time Calculation (min): 24 min  PT Assessment / Plan / Recommendation History of Present Illness  Pt is a 63 y/o female admitted s/p decompressive lumbar laminectomy L3-5.   Clinical Impression  This patient presents with acute pain and decreased functional independence following the above mentioned procedure. At the time of PT eval, pt was educated on proper positioning while sitting in the chair, and the appropriate amount of support needed behind back. Pt did well with functional mobility, and anticipate good progress to d/c home.     PT Assessment  Patient needs continued PT services    Follow Up Recommendations  Home health PT    Does the patient have the potential to tolerate intense rehabilitation      Barriers to Discharge        Equipment Recommendations  Rolling walker with 5" wheels;3in1 (PT)    Recommendations for Other Services     Frequency Min 5X/week    Precautions / Restrictions Precautions Precautions: Fall;Back Precaution Booklet Issued: Yes (comment) Precaution Comments: Discussed precautions in detail. Pt able to recall 3/3 precautions at end of session.  Required Braces or Orthoses: Spinal Brace Spinal Brace: Lumbar corset Restrictions Weight Bearing Restrictions: No   Pertinent Vitals/Pain Pt reports 2/10 pain at rest      Mobility  Bed Mobility General bed mobility comments: NT - pt sitting up in chair upon PT arrival Transfers Overall transfer level: Needs assistance Equipment used: Rolling walker (2 wheeled) Transfers: Sit to/from Stand Sit to Stand: Min guard General transfer comment: VC's for hand placement on seated surface, as well as to maintain back precautions while performing transfers.  Ambulation/Gait Ambulation/Gait assistance: Min guard;Supervision Ambulation Distance (Feet): 200  Feet Assistive device: Rolling walker (2 wheeled) Gait Pattern/deviations: Step-through pattern;Decreased stride length;Narrow base of support Gait velocity: Decreased Gait velocity interpretation: Below normal speed for age/gender General Gait Details: VC's for improved posture and to maintain back precautions while looking around hallway.    Exercises     PT Diagnosis: Difficulty walking;Acute pain  PT Problem List: Decreased strength;Decreased range of motion;Decreased activity tolerance;Decreased balance;Decreased mobility;Decreased knowledge of use of DME;Decreased safety awareness;Pain;Decreased knowledge of precautions PT Treatment Interventions: Gait training;DME instruction;Stair training;Functional mobility training;Therapeutic activities;Therapeutic exercise;Neuromuscular re-education;Patient/family education     PT Goals(Current goals can be found in the care plan section) Acute Rehab PT Goals Patient Stated Goal: To return home  PT Goal Formulation: With patient Time For Goal Achievement: 11/26/13 Potential to Achieve Goals: Good  Visit Information  Last PT Received On: 11/12/13 Assistance Needed: +1 History of Present Illness: Pt is a 63 y/o female admitted s/p decompressive lumbar laminectomy L3-5.        Prior [place] expects to be discharged to:: Private residence Living Arrangements: Spouse/significant other Available Help at Discharge: Friend(s);Available 24 hours/day Type of Home: House Home Access: Stairs to enter [employer] of Steps: 3 Entrance Stairs-Rails: Right Home Layout: Two level (one step down to the den) Home Equipment: Gilford Rile - 4 wheels Prior Function Level of Independence: Independent Comments: Working part time Dominant Hand: Right    Cognition  Cognition Arousal/Alertness: Awake/alert Behavior During Therapy: WFL for tasks assessed/performed Overall Cognitive Status: Within Functional Limits  for tasks assessed    Extremity/Trunk Assessment Upper Extremity Assessment Upper Extremity Assessment: Defer to OT evaluation Lower Extremity Assessment Lower Extremity Assessment: Overall WFL for tasks assessed  Cervical / Trunk Assessment Cervical / Trunk Assessment: Normal   Balance Balance Overall balance assessment: No apparent balance deficits (not formally assessed)  End of Session PT - End of Session Equipment Utilized During Treatment: Gait belt;Back brace Activity Tolerance: Patient tolerated treatment well Patient left: in chair;with call bell/phone within reach Nurse Communication: Mobility status  GP     Jolyn Lent 11/12/2013, 2:08 PM  Jolyn Lent, [place], DPT [phone]

## 2013-11-12 NOTE — Progress Notes (Signed)
    Subjective: Procedure(s) (LRB): DECOMPRESSIVE LUMBAR LAMINECTOMY L3-L5 (N/A) 1 Day Post-Op  Patient reports pain as 3 on 0-10 scale.  Reports decreased leg pain reports incisional back pain   Foley just removed - will monitor for spontaneous void Negative bowel movement Positive flatus Negative chest pain or shortness of breath  Objective: Vital signs in last 24 hours: Temp:  [94.6 F (34.8 C)-98.5 F (36.9 C)] 98.1 F (36.7 C) (01/22 0513) Pulse Rate:  [59-82] 80 (01/22 0513) Resp:  [11-18] 14 (01/22 0513) BP: (106-139)/(52-70) 106/52 mmHg (01/22 0513) SpO2:  [98 %-100 %] 98 % (01/22 0513)  Intake/Output from previous day: 01/21 0701 - 01/22 0700 In: 2970.2 [I.V.:2970.2] Out: 2600 [Urine:2300; Drains:100; Blood:200]  Labs: No results found for this basename: WBC, RBC, HCT, PLT,  in the last 72 hours No results found for this basename: NA, K, CL, CO2, BUN, CREATININE, GLUCOSE, CALCIUM,  in the last 72 hours No results found for this basename: LABPT, INR,  in the last 72 hours  Physical Exam: Neurologically intact ABD soft Neurovascular intact Intact pulses distally Incision: dressing C/D/I and no drainage Compartment soft drain non-functioning - removed  Assessment/Plan: Patient stable  xrays n/a Continue mobilization with physical therapy Continue care  Advance diet Up with therapy D/C IV fluids Plan for discharge tomorrow I will be leaving later today - my partner Dr Tonita Cong is aware and will be covering for me.    Melina Schools, MD [place] [phone]

## 2013-11-13 ENCOUNTER — Encounter (HOSPITAL_COMMUNITY): Payer: Self-pay | Admitting: Orthopedic Surgery

## 2013-11-13 MED ORDER — ONDANSETRON 4 MG PO TBDP
4.0000 mg | ORAL_TABLET | Freq: Three times a day (TID) | ORAL | Status: DC | PRN
Start: 2013-11-13 — End: 2014-01-05

## 2013-11-13 MED ORDER — POLYETHYLENE GLYCOL 3350 17 G PO PACK: 17.0000 g | PACK | Freq: Every day | ORAL | Status: DC

## 2013-11-13 MED ORDER — DSS 100 MG PO CAPS: 100.0000 mg | ORAL_CAPSULE | Freq: Two times a day (BID) | ORAL | Status: DC

## 2013-11-13 MED ORDER — TRAMADOL HCL 50 MG PO TABS: 50.0000 mg | ORAL_TABLET | Freq: Four times a day (QID) | ORAL | Status: DC | PRN

## 2013-11-13 MED ORDER — METHOCARBAMOL 500 MG PO TABS: 500.0000 mg | ORAL_TABLET | Freq: Four times a day (QID) | ORAL | Status: DC | PRN

## 2013-11-13 NOTE — Progress Notes (Signed)
Physical Therapy Treatment Patient Details Name: Jacqueline Tucker MRN: [id number] DOB: 1951-02-15 Today's Date: 11/13/2013 Time: 1020-1043 PT Time Calculation (min): 23 min  PT Assessment / Plan / Recommendation  History of Present Illness Pt is a 63 y/o female admitted s/p decompressive lumbar laminectomy L3-5.    PT Comments   Pt progressing with mobility.  Ambulated entire unit without use of an AD & completed stair training.    Follow Up Recommendations  Home health PT     Does the patient have the potential to tolerate intense rehabilitation     Barriers to Discharge        Equipment Recommendations       Recommendations for Other Services    Frequency Min 5X/week   Progress towards PT Goals    Plan Current plan remains appropriate    Precautions / Restrictions Precautions Precautions: Fall;Back Precaution Comments:  Pt able to recall 3/3 precautions at end of session.  Required Braces or Orthoses: Spinal Brace Spinal Brace: Lumbar corset Restrictions Weight Bearing Restrictions: No       Mobility  Bed Mobility Overal bed mobility: Modified Independent Bed Mobility: Rolling;Sidelying to Sit Transfers Overall transfer level: Modified independent Equipment used: Rolling walker (2 wheeled) Transfers: Sit to/from Stand Ambulation/Gait Ambulation/Gait assistance: Supervision Ambulation Distance (Feet): 500 Feet Assistive device: None Gait Pattern/deviations: Step-through pattern General Gait Details: Pt able to ambulate entire unit without use of an AD.   Stairs: Yes Stairs assistance: Supervision Stair Management: One rail Right;Alternating pattern;Forwards Number of Stairs: 5      PT Goals (current goals can now be found in the care plan section) Acute Rehab PT Goals Patient Stated Goal: To return home  PT Goal Formulation: With patient Time For Goal Achievement: 11/26/13 Potential to Achieve Goals: Good  Visit Information  Last PT Received On:  11/13/13 Assistance Needed: +1 History of Present Illness: Pt is a 63 y/o female admitted s/p decompressive lumbar laminectomy L3-5.     Subjective Data  Patient Stated Goal: To return home    Cognition  Cognition Arousal/Alertness: Awake/alert Behavior During Therapy: WFL for tasks assessed/performed Overall Cognitive Status: Within Functional Limits for tasks assessed    Balance     End of Session PT - End of Session Equipment Utilized During Treatment: Back brace Activity Tolerance: Patient tolerated treatment well Patient left: in chair;with call bell/phone within reach Nurse Communication: Mobility status   GP     [place], [place] 11/13/2013, 2:39 PM  [name], PTA [phone] 11/13/2013

## 2013-11-13 NOTE — Discharge Instructions (Signed)
Home Health physical therapy to be provided by [facility] [phone] °

## 2013-11-30 NOTE — Discharge Summary (Signed)
Patient ID: Jacqueline Tucker MRN: [id number] DOB/AGE: 03-07-1951 63 y.o.  Admit date: 11/11/2013 Discharge date: 11/30/2013  Admission Diagnoses:  Active Problems:   Back pain   Discharge Diagnoses:  Active Problems:   Back pain  status post Procedure(s): DECOMPRESSIVE LUMBAR LAMINECTOMY L3-L5  Past Medical History  Diagnosis Date  . Complication of anesthesia   . PONV (postoperative nausea and vomiting)   . Cold (disease)     getting over cold  . Headache(784.0)     migraines  . Arthritis     Surgeries: Procedure(s): DECOMPRESSIVE LUMBAR LAMINECTOMY L3-L5 on 11/11/2013   Consultants:    Discharged Condition: Improved  Hospital Course: Jacqueline Tucker is an 63 y.o. female who was admitted 11/11/2013 for operative treatment of <principal problem not specified>. Patient failed conservative treatments (please see the history and physical for the specifics) and had severe unremitting pain that affects sleep, daily activities and work/hobbies. After pre-op clearance, the patient was taken to the operating room on 11/11/2013 and underwent  Procedure(s): DECOMPRESSIVE LUMBAR LAMINECTOMY L3-L5.    Patient was given perioperative antibiotics:  Anti-infectives   Start     Dose/Rate Route Frequency Ordered Stop   11/11/13 1600  ceFAZolin (ANCEF) IVPB 1 g/50 mL premix     1 g 100 mL/hr over 30 Minutes Intravenous Every 8 hours 11/11/13 1413 11/12/13 0230   11/10/13 1427  ceFAZolin (ANCEF) IVPB 2 g/50 mL premix     2 g 100 mL/hr over 30 Minutes Intravenous 30 min pre-op 11/10/13 1427 11/11/13 0830       Patient was given sequential compression devices and early ambulation to prevent DVT.   Patient benefited maximally from hospital stay and there were no complications. At the time of discharge, the patient was urinating/moving their bowels without difficulty, tolerating a regular diet, pain is controlled with oral pain medications and they have been cleared by PT/OT.   Recent  vital signs: No data found.    Recent laboratory studies: No results found for this basename: WBC, HGB, HCT, PLT, NA, K, CL, CO2, BUN, CREATININE, GLUCOSE, PT, INR, CALCIUM, 2,  in the last 72 hours   Discharge Medications:     Medication List    STOP taking these medications       etodolac 400 MG tablet  Commonly known as:  LODINE      TAKE these medications       calcium carbonate 600 MG Tabs tablet  Commonly known as:  OS-CAL  Take 600 mg by mouth daily with breakfast.     DSS 100 MG Caps  Take 100 mg by mouth 2 (two) times daily.     ergocalciferol 50000 UNITS capsule  Commonly known as:  VITAMIN D2  Take 50,000 Units by mouth every 14 (fourteen) days.     FISH OIL PO  Take 1 capsule by mouth daily.     folic acid 1 MG tablet  Commonly known as:  FOLVITE  Take 2 mg by mouth daily.     methocarbamol 500 MG tablet  Commonly known as:  ROBAXIN  Take 1 tablet (500 mg total) by mouth every 6 (six) hours as needed for muscle spasms.     methotrexate 2.5 MG tablet  Commonly known as:  RHEUMATREX  Take 20 mg by mouth once a week.     multivitamin with minerals Tabs tablet  Take 1 tablet by mouth daily.     ondansetron 4 MG disintegrating tablet  Commonly  known as:  ZOFRAN ODT  Take 1 tablet (4 mg total) by mouth every 8 (eight) hours as needed for nausea or vomiting.     polyethylene glycol packet  Commonly known as:  MIRALAX / GLYCOLAX  Take 17 g by mouth daily.     traMADol 50 MG tablet  Commonly known as:  ULTRAM  Take 1 tablet (50 mg total) by mouth every 6 (six) hours as needed for moderate pain.        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  Initial radiograph demonstrates a surgical probe posterior to the superior endplate of L3 and  a surgical probe at L5-S1.  Second radiograph demonstrates a surgical probe along the inferior aspect of the L3 vertebral body and a surgical probe along the superior aspect of the S1 vertebral body.  IMPRESSION: Intraoperative lumbar localization, as above.   Electronically Signed   By: Julian Hy M.D.   On: 11/11/2013 10:30   Dg Lumbar Spine 2-3 Views  11/04/2013   CLINICAL DATA:  Decompressive lumbar laminectomy L3-L5.  EXAM: LUMBAR SPINE - 2-3 VIEW  COMPARISON:  05/30/2006, MRI on 03/05/2013  FINDINGS: Disc height loss is identified at L4-5 and L5-S1. There is 5 mm of anterolisthesis of L3 on L4, likely degenerative. No evidence for acute fracture. No lytic or blastic lesions are identified.  IMPRESSION: 1. Mild degenerative changes. 2. Grade 1 anterolisthesis of L3 on L4.   Electronically Signed   By: Shon Hale M.D.   On: 11/04/2013 11:42   Dg Spine Portable 1 View  11/11/2013   CLINICAL DATA:  L3-5 spinal stenosis. Intraoperative localization film.  EXAM: PORTABLE SPINE - 1 VIEW  COMPARISON:  Plain films lumbar spine 11/04/2013.  FINDINGS: 2 probes are in place. The more superior is at the level of the L4 pedicles. The second probe is at the level of the L5 pedicles.  IMPRESSION: Localization as above.   Electronically Signed   By: Inge Rise M.D.   On: 11/11/2013 09:21        Discharge Orders   Future Orders Complete By Expires   Call MD / Call 911  As directed    Comments:     If you experience chest pain or shortness of breath, CALL 911 and be transported to the hospital emergency room.  If you develope a fever above 101 F, pus (white drainage) or increased drainage or redness at the wound, or calf pain, call your surgeon's office.   Constipation Prevention  As directed    Comments:     Drink plenty of fluids.  Prune juice may be helpful.  You may use a stool softener, such as Colace (over the counter) 100 mg twice a day.  Use MiraLax (over the counter) for constipation as  needed.   Diet - low sodium heart healthy  As directed    Discharge instructions  As directed    Comments:     Ok to shower 5 days postop.  Do not apply any creams or ointments to incision.  Do not remove steri-strips.  Can use 4x4 gauze and tape for dressing changes.  No aggressive activity.  No bending, squatting or prolonged sitting.  Mostly be in reclined position or lying down.  Wear brace.  Ok to do  some walking.   Driving restrictions  As directed    Comments:     No driving until further notice.   Increase activity slowly as tolerated  As directed    Lifting restrictions  As directed    Comments:     No lifting until further notice.      Follow-up Information   Schedule an appointment as soon as possible for a visit with Dahlia Bailiff, MD. (need return office visit 2 weeks postop)    Specialty:  Orthopedic Surgery   Contact information:   [address] [place] [address] [place] [address] [phone]       Discharge Plan:  discharge to home      Signed: Lanae Crumbly for Dr. Melina Schools [facility] [phone] 11/30/2013, 4:06 PM

## 2013-12-01 NOTE — Discharge Summary (Signed)
Agree with above 

## 2014-01-05 ENCOUNTER — Ambulatory Visit (INDEPENDENT_AMBULATORY_CARE_PROVIDER_SITE_OTHER): Payer: BC Managed Care – PPO | Admitting: Certified Nurse Midwife

## 2014-01-05 ENCOUNTER — Encounter: Payer: Self-pay | Admitting: Certified Nurse Midwife

## 2014-01-05 VITALS — BP 115/72 | HR 87 | Resp 16 | Ht 64.75 in | Wt 153.0 lb

## 2014-01-05 DIAGNOSIS — E559 Vitamin D deficiency, unspecified: Secondary | ICD-10-CM

## 2014-01-05 DIAGNOSIS — Z0000 Encounter for general adult medical examination without abnormal findings: Secondary | ICD-10-CM

## 2014-01-05 DIAGNOSIS — Z01419 Encounter for gynecological examination (general) (routine) without abnormal findings: Secondary | ICD-10-CM

## 2014-01-05 DIAGNOSIS — Z1211 Encounter for screening for malignant neoplasm of colon: Secondary | ICD-10-CM

## 2014-01-05 LAB — POCT URINALYSIS DIPSTICK
Bilirubin, UA: NEGATIVE
Blood, UA: NEGATIVE
Glucose, UA: NEGATIVE
KETONES UA: NEGATIVE
Leukocytes, UA: NEGATIVE
Nitrite, UA: NEGATIVE
PH UA: 5
Protein, UA: NEGATIVE
Urobilinogen, UA: NEGATIVE

## 2014-01-05 NOTE — Patient Instructions (Addendum)

## 2014-01-05 NOTE — Progress Notes (Signed)
63 y.o. [id number] Married Caucasian Fe here for annual exam. Menopausal no vaginal bleeding or vaginal bleeding. Patient had spinal surgery for spinal stenosis 2 months ago with some height loss. Sees PCP aex , labs and any other problems. Patient taking Vitamin D as directed. Patient expressed her concern she was not notified that Dr. Joan Flores had retired and that her appointment was incorrect. She did feel it was handled appropriately by staff when she called. "Just wanted to let you know". No other health concerns today.  Patient's last menstrual period was 10/22/1998.          Sexually active: yes  The current method of family planning is post menopausal status.    Exercising: yes  walking & yoga Smoker:  no  Health Maintenance: Pap: 12-29-12 neg HPV HR neg MMG: 07-14-13 normal Colonoscopy:  2007 BMD:   2012 TDaP:  2008 Labs: Poct urine-neg Self breast exam: done occ   reports that she has never smoked. She has never used smokeless tobacco. She reports that she drinks alcohol. She reports that she does not use illicit drugs.  Past Medical History  Diagnosis Date  . Complication of anesthesia   . PONV (postoperative nausea and vomiting)   . Cold (disease)     getting over cold  . Headache(784.0)     migraines  . Arthritis   . Rheumatoid arthritis   . Abnormal Pap smear of cervix 10/99    LGSIL    Past Surgical History  Procedure Laterality Date  . Wisdom tooth extraction  1968  . Lumbar laminectomy  11/11/2013    L3   L5     DR BROOKS  . Decompressive lumbar laminectomy level 2 N/A 11/11/2013    Procedure: DECOMPRESSIVE LUMBAR LAMINECTOMY L3-L5;  Surgeon: Melina Schools, MD;  Location: [facility];  Service: Orthopedics;  Laterality: N/A;  . Cryotherapy  1984    dysplasia  . Dilation and curettage of uterus  1990    [facility]    Current Outpatient Prescriptions  Medication Sig Dispense Refill  . calcium carbonate (OS-CAL) 600 MG TABS tablet Take 600 mg by mouth daily with breakfast.       . ergocalciferol (VITAMIN D2) 50000 UNITS capsule Take 50,000 Units by mouth every 14 (fourteen) days.      Marland Kitchen ETODOLAC PO Take by mouth daily.      . folic acid (FOLVITE) 1 MG tablet Take 2 mg by mouth daily.      . methotrexate (RHEUMATREX) 2.5 MG tablet Takes 8 tablets a week      . Multiple Vitamin (MULTIVITAMIN WITH MINERALS) TABS tablet Take 1 tablet by mouth daily.      . Omega-3 Fatty Acids (FISH OIL PO) Take 1 capsule by mouth daily.       No current facility-administered medications for this visit.    Family History  Problem Relation Age of Onset  . Breast cancer Mother   . Hypertension Mother   . Thyroid disease Mother   . Cancer Father     prostate  . Hypertension Father   . Thyroid disease Father   . Heart attack Maternal Grandmother   . Cancer Maternal Grandfather     blood  . Cancer Paternal Grandmother     stomach    ROS:  Pertinent items are noted in HPI.  Otherwise, a comprehensive ROS was negative.  Exam:   BP 115/72  Pulse 87  Resp 16  Ht 5' 4.75" (1.645 m)  Wt 153  lb (69.4 kg)  BMI 25.65 kg/m2  LMP 10/22/1998 Height: 5' 4.75" (164.5 cm)  Ht Readings from Last 3 Encounters:  01/05/14 5' 4.75" (1.645 m)  11/04/13 5\' 6"  (1.676 m)    General appearance: alert, cooperative and appears stated age Head: Normocephalic, without obvious abnormality, atraumatic Neck: no adenopathy, supple, symmetrical, trachea midline and thyroid normal to inspection and palpation and non-palpable Lungs: clear to auscultation bilaterally Breasts: normal appearance, no masses or tenderness, No nipple retraction or dimpling, No nipple discharge or bleeding, No axillary or supraclavicular adenopathy Heart: regular rate and rhythm Abdomen: soft, non-tender; no masses,  no organomegaly Extremities: extremities normal, atraumatic, no cyanosis or edema Skin: Skin color, texture, turgor normal. No rashes or lesions Lymph nodes: Cervical, supraclavicular, and axillary nodes  normal. No abnormal inguinal nodes palpated Neurologic: Grossly normal   Pelvic: External genitalia:  no lesions              Urethra:  normal appearing urethra with no masses, tenderness or lesions              Bartholin's and Skene's: normal                 Vagina: normal appearing vagina with normal color and discharge, no lesions              Cervix: normal appearance, non tender, tiny polyp noted inside cervical os Pap smear taken noted polyp in 2014 no change in appearance              Pap taken: yes Bimanual Exam:  Uterus:  normal size, contour, position, consistency, mobility, non-tender and anteflexed              Adnexa: normal adnexa and no mass, fullness, tenderness               Rectovaginal: Confirms               Anus:  normal sphincter tone, no lesions  A:  Well Woman with normal exam  Menopausal no HRT  History of abnormal pap with cryo in 1994  Rheumatoid Arthritis on stable medication with MD  Cervical polyp asymptomatic, no change from previous exam   History of Vitamin D deficiency  Recent spine surgery for stenosis   P:   Reviewed health and wellness pertinent to exam  Aware of importance to advise if no vaginal bleeding  Stressed importance of aex  Continue follow up with MD as indicated  Aware of benign findings and will advise if bleeding  Lab Vitamin D  Continue follow up with MD, and will check to see if she have expected change in height with surgery. One inch change today on assessment. Patient will advise.  IFOB dispensed with instructions  Pap smear as per guidelines   Mammogram yearly pap smear taken with reflex Apologized to patient regarding no notification received of Dr. Joan Flores retirement, but reminded she had her appointments with me since 2008 and that may have been the reason,no notification. Assured patient she has physician access here just as before. She can make appointment with MD. For next aex if desired. Patient declined, she just  wanted to know "she had a doctor if needed." Felt much better with after discussing coverage and our practice as a whole working together. No further questions.  counseled on breast self exam, mammography screening, adequate intake of calcium and vitamin D, diet and exercise  return annually or prn  An After Visit Summary  was printed and given to the patient.

## 2014-01-06 LAB — IPS PAP TEST WITH REFLEX TO HPV

## 2014-01-06 LAB — VITAMIN D 25 HYDROXY (VIT D DEFICIENCY, FRACTURES): Vit D, 25-Hydroxy: 41 ng/mL (ref 30–89)

## 2014-01-08 NOTE — Progress Notes (Signed)
Reviewed personally.  M. Suzanne Maelynn Moroney, MD.  

## 2014-01-10 ENCOUNTER — Encounter: Payer: Self-pay | Admitting: Certified Nurse Midwife

## 2014-01-14 LAB — FECAL OCCULT BLOOD, IMMUNOCHEMICAL: IFOBT: NEGATIVE

## 2014-07-22 ENCOUNTER — Encounter: Payer: Self-pay | Admitting: Podiatry

## 2014-07-22 ENCOUNTER — Ambulatory Visit (INDEPENDENT_AMBULATORY_CARE_PROVIDER_SITE_OTHER): Payer: BC Managed Care – PPO | Admitting: Podiatry

## 2014-07-22 ENCOUNTER — Ambulatory Visit (INDEPENDENT_AMBULATORY_CARE_PROVIDER_SITE_OTHER): Payer: BC Managed Care – PPO

## 2014-07-22 VITALS — BP 149/84 | HR 74 | Temp 97.4°F | Resp 14 | Ht 65.0 in | Wt 143.0 lb

## 2014-07-22 DIAGNOSIS — M79672 Pain in left foot: Secondary | ICD-10-CM

## 2014-07-22 DIAGNOSIS — L03032 Cellulitis of left toe: Secondary | ICD-10-CM

## 2014-07-22 MED ORDER — CEPHALEXIN 500 MG PO CAPS: 500.0000 mg | ORAL_CAPSULE | Freq: Three times a day (TID) | ORAL | Status: DC

## 2014-07-22 NOTE — Progress Notes (Signed)
Subjective:     Patient ID: Jacqueline Tucker, female   DOB: 05-09-1951, 63 y.o.   MRN: [id number]  Foot Pain   patient presents stating that about a month ago she started to have pain in the side of her left foot and that recently she's had a blister on her left fourth interspace and redness surrounding the area   Review of Systems  All other systems reviewed and are negative.      Objective:   Physical Exam  Nursing note and vitals reviewed. Constitutional: She is oriented to person, place, and time.  Cardiovascular: Intact distal pulses.   Musculoskeletal: Normal range of motion.  Neurological: She is oriented to person, place, and time.  Skin: Skin is warm.   neurovascular status intact with muscle strength adequate range of motion subtalar midtarsal joint within normal limits. Patient is noted to have erythema and a small blister and abscess fourth interspace left foot and a keratotic lesion on the inside of the fourth toe left with patient admitting that she may have picked at this lesion about a month ago. Patient's digits are well-perfused and no indications of advanced arthritis at this time     Assessment:     Abscess fourth interspace left with keratotic lesion fourth toe that probably allowed bacterial infiltration to this area. It is localized with no indications of proximal edema erythema or drainage    Plan:     H&P and x-ray reviewed. Today I did a sterile block of the area 60 mg Xylocaine Marcaine mixture I then went ahead and I using sterile instrumentation clipped tissue and allowed drainage to occur with no purulent drainage for culture. Placed on cephalexin 500 mg 3 times a day and soaks and instructed on keeping the fourth and fifth toes separated and wearing his surgical shoe. If symptoms were to persist or if any increase in redness swelling or pain were to occur she is to let me know immediately

## 2014-07-22 NOTE — Patient Instructions (Signed)

## 2014-07-22 NOTE — Progress Notes (Signed)
   Subjective:    Patient ID: Jacqueline Tucker, female    DOB: 1951-03-21, 63 y.o.   MRN: [id number]  HPI Comments: Pt states about one month ago, she began to have a burning pain when standing/walking and pressing on the left dorsal 3, 4th MPJ areas, with occasion burning in the 3, 4th toes.  Pt states blister to left 4th toe base appeared this weekend, but has had a corn on the 4th toe for years.  Pt states she would like to let Dr. Paulla Dolly know she has her dtr's wedding in three weeks and would like to keep that in mind when planning treatment.  Foot Pain Associated symptoms include arthralgias.      Review of Systems  Musculoskeletal: Positive for arthralgias.  Skin:       Pt states hurt finger and toe nails have begun to scale.  Neurological: Positive for light-headedness.  Hematological: Bruises/bleeds easily.  All other systems reviewed and are negative.      Objective:   Physical Exam        Assessment & Plan:

## 2014-08-23 ENCOUNTER — Encounter: Payer: Self-pay | Admitting: Podiatry

## 2015-01-10 ENCOUNTER — Encounter: Payer: Self-pay | Admitting: Certified Nurse Midwife

## 2015-01-10 ENCOUNTER — Ambulatory Visit (INDEPENDENT_AMBULATORY_CARE_PROVIDER_SITE_OTHER): Payer: BC Managed Care – PPO | Admitting: Certified Nurse Midwife

## 2015-01-10 VITALS — BP 120/68 | HR 80 | Resp 16 | Ht 65.0 in | Wt 145.4 lb

## 2015-01-10 DIAGNOSIS — N390 Urinary tract infection, site not specified: Secondary | ICD-10-CM

## 2015-01-10 DIAGNOSIS — N841 Polyp of cervix uteri: Secondary | ICD-10-CM | POA: Diagnosis not present

## 2015-01-10 DIAGNOSIS — Z124 Encounter for screening for malignant neoplasm of cervix: Secondary | ICD-10-CM

## 2015-01-10 DIAGNOSIS — Z1211 Encounter for screening for malignant neoplasm of colon: Secondary | ICD-10-CM

## 2015-01-10 DIAGNOSIS — Z0000 Encounter for general adult medical examination without abnormal findings: Secondary | ICD-10-CM | POA: Diagnosis not present

## 2015-01-10 DIAGNOSIS — Z01419 Encounter for gynecological examination (general) (routine) without abnormal findings: Secondary | ICD-10-CM | POA: Diagnosis not present

## 2015-01-10 LAB — POCT URINALYSIS DIPSTICK
Bilirubin, UA: NEGATIVE
Glucose, UA: NEGATIVE
KETONES UA: NEGATIVE
Nitrite, UA: NEGATIVE
PROTEIN UA: NEGATIVE
Urobilinogen, UA: NEGATIVE
pH, UA: 5

## 2015-01-10 LAB — URINALYSIS, MICROSCOPIC ONLY
Bacteria, UA: NONE SEEN
Casts: NONE SEEN
Crystals: NONE SEEN
RBC / HPF: NONE SEEN RBC/hpf (ref <3)
WBC, UA: NONE SEEN WBC/hpf (ref <3)

## 2015-01-10 LAB — CBC
HCT: 39.1 % (ref 36.0–46.0)
Hemoglobin: 13 g/dL (ref 12.0–15.0)
MCH: 30.4 pg (ref 26.0–34.0)
MCHC: 33.2 g/dL (ref 30.0–36.0)
MCV: 91.6 fL (ref 78.0–100.0)
MPV: 10 fL (ref 8.6–12.4)
PLATELETS: 152 10*3/uL (ref 150–400)
RBC: 4.27 MIL/uL (ref 3.87–5.11)
RDW: 14.8 % (ref 11.5–15.5)
WBC: 4 10*3/uL (ref 4.0–10.5)

## 2015-01-10 LAB — HEMOGLOBIN, FINGERSTICK: HEMOGLOBIN, FINGERSTICK: 12.8 g/dL (ref 12.0–16.0)

## 2015-01-10 LAB — LIPID PANEL
Cholesterol: 139 mg/dL (ref 0–200)
HDL: 60 mg/dL (ref >=46)
LDL Cholesterol: 68 mg/dL (ref 0–99)
Total CHOL/HDL Ratio: 2.3 Ratio
Triglycerides: 55 mg/dL (ref <150)
VLDL: 11 mg/dL (ref 0–40)

## 2015-01-10 LAB — TSH: TSH: 2.899 u[IU]/mL (ref 0.350–4.500)

## 2015-01-10 LAB — VITAMIN D 25 HYDROXY (VIT D DEFICIENCY, FRACTURES): VIT D 25 HYDROXY: 40 ng/mL (ref 30–100)

## 2015-01-10 NOTE — Progress Notes (Signed)
64 y.o. [id number] Married  Caucasian Fe here for annual exam. Menopausal no HRT. Denies vaginal bleeding. Vaginal dryness no issues. Mother passed in 2015 of cardiac event. Daughter married and mother attended recently before death. Emotionally doing well with family support. Continues with Rheumatology management for RA no medication changes. Needs screening labs, only liver profile with with Rheumatology. Denies any urinary symptoms done, just has not had a lot of fluids to drink. No other health issues today.  Patient's last menstrual period was 10/22/1998.          Sexually active: Yes.    The current method of family planning is post menopausal status.    Exercising: Yes.    walking & yoga Smoker:  no  Health Maintenance: Pap:  01-05-14 neg  MMG:  08-19-14 category b density,birads 1:NEG Colonoscopy:  2007  BMD:   2012 TDaP:  2008 Labs: Poct urine-rbc, wbc-tr, Hgb-12.8 Self breast exam: done occ   reports that she has never smoked. She has never used smokeless tobacco. She reports that she drinks alcohol. She reports that she does not use illicit drugs.  Past Medical History  Diagnosis Date  . Complication of anesthesia   . PONV (postoperative nausea and vomiting)   . Cold (disease)     getting over cold  . Headache(784.0)     migraines  . Arthritis   . Rheumatoid arthritis   . Abnormal Pap smear of cervix 10/99    LGSIL    Past Surgical History  Procedure Laterality Date  . Wisdom tooth extraction  1968  . Lumbar laminectomy  11/11/2013    L3   L5     DR BROOKS  . Decompressive lumbar laminectomy level 2 N/A 11/11/2013    Procedure: DECOMPRESSIVE LUMBAR LAMINECTOMY L3-L5;  Surgeon: Melina Schools, MD;  Location: [facility];  Service: Orthopedics;  Laterality: N/A;  . Cryotherapy  1984    dysplasia  . Dilation and curettage of uterus  1990    SAB    Current Outpatient Prescriptions  Medication Sig Dispense Refill  . Cholecalciferol (VITAMIN D PO) Take by mouth daily.    .  ETODOLAC PO Take by mouth daily.    . folic acid (FOLVITE) 1 MG tablet Take 2 mg by mouth daily.    . methotrexate (RHEUMATREX) 2.5 MG tablet Takes 8 tablets a week    . Multiple Vitamin (MULTIVITAMIN WITH MINERALS) TABS tablet Take 1 tablet by mouth every other day.     . Omega-3 Fatty Acids (FISH OIL PO) Take 1 capsule by mouth daily.     No current facility-administered medications for this visit.    Family History  Problem Relation Age of Onset  . Breast cancer Mother   . Hypertension Mother   . Thyroid disease Mother   . Cancer Father     prostate  . Hypertension Father   . Thyroid disease Father   . Heart attack Maternal Grandmother   . Cancer Maternal Grandfather     blood  . Cancer Paternal Grandmother     stomach    ROS:  Pertinent items are noted in HPI.  Otherwise, a comprehensive ROS was negative.  Exam:   BP 120/68 mmHg  Pulse 80  Resp 16  Ht 5\' 5"  (1.651 m)  Wt 145 lb 6.4 oz (65.953 kg)  BMI 24.20 kg/m2  LMP 10/22/1998 Height: 5\' 5"  (165.1 cm) Ht Readings from Last 3 Encounters:  01/10/15 5\' 5"  (1.651 m)  07/22/14 5\' 5"  (1.651  m)  01/05/14 5' 4.75" (1.645 m)    General appearance: alert, cooperative and appears stated age Head: Normocephalic, without obvious abnormality, atraumatic Neck: no adenopathy, supple, symmetrical, trachea midline and thyroid normal to inspection and palpation Lungs: clear to auscultation bilaterally CVAT negative bilateral Breasts: normal appearance, no masses or tenderness, No nipple retraction or dimpling, No nipple discharge or bleeding, No axillary or supraclavicular adenopathy Heart: regular rate and rhythm Abdomen: soft, non-tender; no masses,  no organomegaly, negative suprapubic Extremities: extremities normal, atraumatic, no cyanosis or edema Skin: Skin color, texture, turgor normal. No rashes or lesions Lymph nodes: Cervical, supraclavicular, and axillary nodes normal. No abnormal inguinal nodes  palpated Neurologic: Grossly normal   Pelvic: External genitalia:  no lesions, normal female              Urethra:  normal appearing urethra with no masses, tenderness or lesions  Bladder/urethral meatus non tender              Bartholin's and Skene's: normal                 Vagina: normal appearing vagina with normal color and discharge, no lesions              Cervix: normal, non tender, no lesions, tiny polyp noted in endocervical canal, not friable              Pap taken: Yes.   Bimanual Exam:  Uterus:  normal size, contour, position, consistency, mobility, non-tender              Adnexa: normal adnexa and no mass, fullness, tenderness               Rectovaginal: Confirms               Anus:  normal sphincter tone, no lesions  Chaperone present  A:  Well Woman with normal exam  Menopausal no HRT.  RA stable with Rheumatology management  Screening labs  Cervical polyp noted on previous exam no change  R/O UTI  P:   Reviewed health and wellness pertinent to exam  Aware of need to evaluate if vaginal bleeding  Continue with MD as indicated  Labs:Lipids, CBC, TSH, Vitamin D  Discussed cervical polyp still present no change and to advise if has bleeding, benign finding, but can be removed if needed.  Lab: urine micro  Aware of UTI symptoms  Pap smear taken today with HPVHR  IFOB dispensed colonoscopy due in 2017   counseled on breast self exam, adequate intake of calcium and vitamin D, diet and exercise, Kegel's exercises  return annually or prn  An After Visit Summary was printed and given to the patient.

## 2015-01-10 NOTE — Patient Instructions (Signed)
EXERCISE AND DIET:  We recommended that you start or continue a regular exercise program for good health. Regular exercise means any activity that makes your heart beat faster and makes you sweat.  We recommend exercising at least 30 minutes per day at least 3 days a week, preferably 4 or 5.  We also recommend a diet low in fat and sugar.  Inactivity, poor dietary choices and obesity can cause diabetes, heart attack, stroke, and kidney damage, among others.    ALCOHOL AND SMOKING:  Women should limit their alcohol intake to no more than 7 drinks/beers/glasses of wine (combined, not each!) per week. Moderation of alcohol intake to this level decreases your risk of breast cancer and liver damage. And of course, no recreational drugs are part of a healthy lifestyle.  And absolutely no smoking or even second hand smoke. Most people know smoking can cause heart and lung diseases, but did you know it also contributes to weakening of your bones? Aging of your skin?  Yellowing of your teeth and nails?  CALCIUM AND VITAMIN D:  Adequate intake of calcium and Vitamin D are recommended.  The recommendations for exact amounts of these supplements seem to change often, but generally speaking 600 mg of calcium (either carbonate or citrate) and 800 units of Vitamin D per day seems prudent. Certain women may benefit from higher intake of Vitamin D.  If you are among these women, your doctor will have told you during your visit.    PAP SMEARS:  Pap smears, to check for cervical cancer or precancers,  have traditionally been done yearly, although recent scientific advances have shown that most women can have pap smears less often.  However, every woman still should have a physical exam from her gynecologist every year. It will include a breast check, inspection of the vulva and vagina to check for abnormal growths or skin changes, a visual exam of the cervix, and then an exam to evaluate the size and shape of the uterus and  ovaries.  And after 64 years of age, a rectal exam is indicated to check for rectal cancers. We will also provide age appropriate advice regarding health maintenance, like when you should have certain vaccines, screening for sexually transmitted diseases, bone density testing, colonoscopy, mammograms, etc.   MAMMOGRAMS:  All women over 40 years old should have a yearly mammogram. Many facilities now offer a "3D" mammogram, which may cost around $50 extra out of pocket. If possible,  we recommend you accept the option to have the 3D mammogram performed.  It both reduces the number of women who will be called back for extra views which then turn out to be normal, and it is better than the routine mammogram at detecting truly abnormal areas.    COLONOSCOPY:  Colonoscopy to screen for colon cancer is recommended for all women at age 50.  We know, you hate the idea of the prep.  We agree, BUT, having colon cancer and not knowing it is worse!!  Colon cancer so often starts as a polyp that can be seen and removed at colonscopy, which can quite literally save your life!  And if your first colonoscopy is normal and you have no family history of colon cancer, most women don't have to have it again for 10 years.  Once every ten years, you can do something that may end up saving your life, right?  We will be happy to help you get it scheduled when you are ready.    Be sure to check your insurance coverage so you understand how much it will cost.  It may be covered as a preventative service at no cost, but you should check your particular policy.     Asymptomatic Bacteriuria Asymptomatic bacteriuria is the presence of a large number of bacteria in your urine without the usual symptoms of burning or frequent urination. The following conditions increase the risk of asymptomatic bacteriuria:  Diabetes mellitus.  Advanced age.  Pregnancy in the first trimester.  Kidney stones.  Kidney transplants.  Leaky kidney  tube valve in young children (reflux). Treatment for this condition is not needed in most people and can lead to other problems such as too much yeast and growth of resistant bacteria. However, some people, such as pregnant women, do need treatment to prevent kidney infection. Asymptomatic bacteriuria in pregnancy is also associated with fetal growth restriction, premature labor, and newborn death. HOME CARE INSTRUCTIONS Monitor your condition for any changes. The following actions may help to relieve any discomfort you are feeling:  Drink enough water and fluids to keep your urine clear or pale yellow. Go to the bathroom more often to keep your bladder empty.  Keep the area around your vagina and rectum clean. Wipe yourself from front to back after urinating. SEEK IMMEDIATE MEDICAL CARE IF:  You develop signs of an infection such as:  Burning with urination.  Frequency of voiding.  Back pain.  Fever.  You have blood in the urine.  You develop a fever. MAKE SURE YOU:  Understand these instructions.  Will watch your condition.  Will get help right away if you are not doing well or get worse. Document Released: 10/08/2005 Document Revised: 02/22/2014 Document Reviewed: 03/30/2013 [facility] Patient Information 2015 [place], [place]. This information is not intended to replace advice given to you by your health care provider. Make sure you discuss any questions you have with your health care provider.

## 2015-01-12 LAB — IPS PAP TEST WITH HPV

## 2015-01-12 LAB — FECAL OCCULT BLOOD, IMMUNOCHEMICAL: IMMUNOLOGICAL FECAL OCCULT BLOOD TEST: NEGATIVE

## 2015-01-19 NOTE — Progress Notes (Signed)
Reviewed personally.  M. Suzanne Gay Rape, MD.  

## 2015-03-14 IMAGING — CR DG LUMBAR SPINE 2-3V
2 series · 2 of 2 positions shown · non-contrast
Comparison: 05/30/2006, MRI on 03/05/2013

CLINICAL DATA: Decompressive lumbar laminectomy L3-L5.

EXAM:
LUMBAR SPINE - 2-3 VIEW

[t lumbar spine ap]
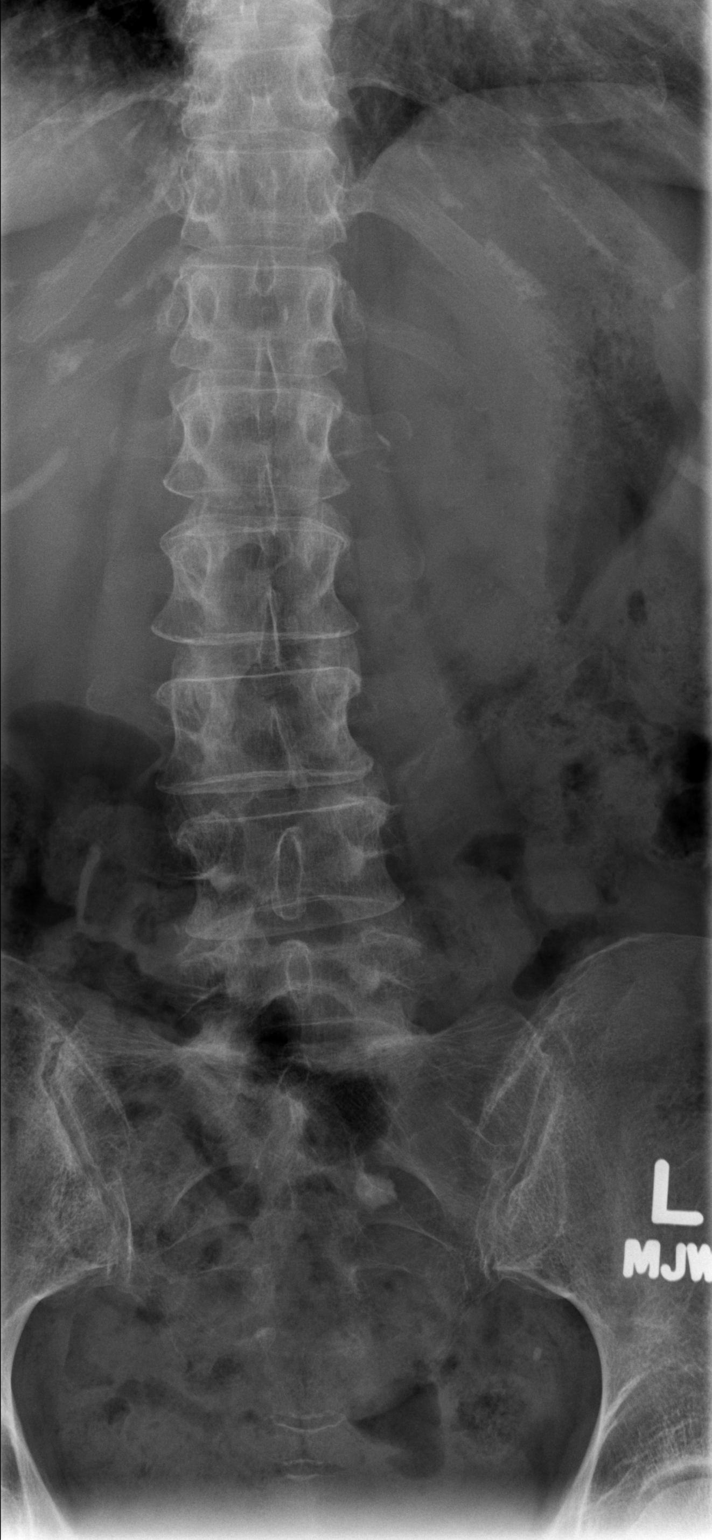

[t lumbar spine lat]
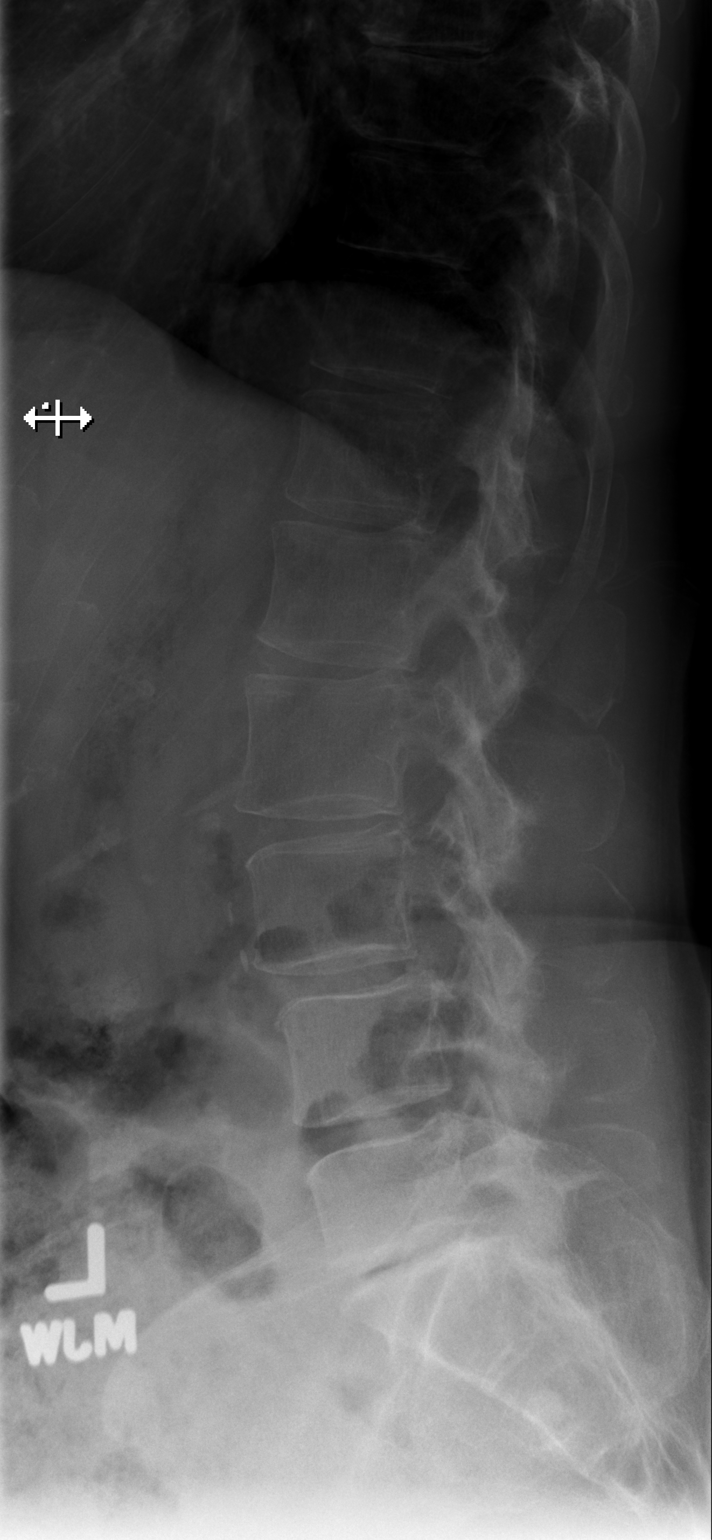

[2 of 2 positions shown; findings below may reference images not displayed]

FINDINGS: Disc height loss is identified at L4-5 and L5-S1. There is 5 mm of
anterolisthesis of L3 on L4, likely degenerative. No evidence for
acute fracture. No lytic or blastic lesions are identified.
IMPRESSION: 1. Mild degenerative changes.
2. Grade 1 anterolisthesis of L3 on L4.

## 2016-01-12 ENCOUNTER — Encounter: Payer: Self-pay | Admitting: Certified Nurse Midwife

## 2016-01-12 ENCOUNTER — Ambulatory Visit (INDEPENDENT_AMBULATORY_CARE_PROVIDER_SITE_OTHER): Payer: BC Managed Care – PPO | Admitting: Certified Nurse Midwife

## 2016-01-12 VITALS — BP 118/78 | HR 70 | Resp 16 | Ht 64.75 in | Wt 152.0 lb

## 2016-01-12 DIAGNOSIS — Z01419 Encounter for gynecological examination (general) (routine) without abnormal findings: Secondary | ICD-10-CM

## 2016-01-12 DIAGNOSIS — Z1211 Encounter for screening for malignant neoplasm of colon: Secondary | ICD-10-CM | POA: Diagnosis not present

## 2016-01-12 DIAGNOSIS — Z0000 Encounter for general adult medical examination without abnormal findings: Secondary | ICD-10-CM

## 2016-01-12 LAB — POCT URINALYSIS DIPSTICK
Bilirubin, UA: NEGATIVE
Blood, UA: NEGATIVE
Glucose, UA: NEGATIVE
Ketones, UA: NEGATIVE
Leukocytes, UA: NEGATIVE
Nitrite, UA: NEGATIVE
Protein, UA: NEGATIVE
UROBILINOGEN UA: NEGATIVE
pH, UA: 5

## 2016-01-12 LAB — LIPID PANEL
CHOLESTEROL: 152 mg/dL (ref 125–200)
HDL: 71 mg/dL (ref >=46)
LDL Cholesterol: 68 mg/dL (ref <130)
Total CHOL/HDL Ratio: 2.1 Ratio (ref <=5.0)
Triglycerides: 67 mg/dL (ref <150)
VLDL: 13 mg/dL (ref <30)

## 2016-01-12 NOTE — Patient Instructions (Signed)

## 2016-01-12 NOTE — Progress Notes (Signed)
65 y.o. [id number] Married  Caucasian Fe here for annual exam. Menopausal no HRT. Denies vaginal bleeding. Occasional vaginal dryness, uses coconut oil prn. Liver enzymes elevated from RA medication,now off antiinflammatory meds, so more back pain. Follow up soon with Rheumatology. See PCP prn. No other health concerns today. Just returned from [place] trip!  Patient's last menstrual period was 10/22/1998.          Sexually active: Yes.    The current method of family planning is post menopausal status.    Exercising: Yes.    walking,yoga Smoker:  no  Health Maintenance: Pap:  01-10-15 neg HPV HR neg MMG:  Bilateral 08-26-15,08-30-15 left breast category 2 :neg Colonoscopy:  2007 f/u 66yrs  BMD:   2012 TDaP:  2008 Shingles: no Pneumonia:had done Hep C and HIV: pt believes she may have had it done Labs: urine-neg Self breast exam: done occ   reports that she has never smoked. She has never used smokeless tobacco. She reports that she does not drink alcohol or use illicit drugs.  Past Medical History  Diagnosis Date  . Complication of anesthesia   . PONV (postoperative nausea and vomiting)   . Cold (disease)     getting over cold  . Headache(784.0)     migraines  . Arthritis   . Rheumatoid arthritis ([place])   . Abnormal Pap smear of cervix 10/99    LGSIL    Past Surgical History  Procedure Laterality Date  . Wisdom tooth extraction  1968  . Lumbar laminectomy  11/11/2013    L3   L5     DR BROOKS  . Decompressive lumbar laminectomy level 2 N/A 11/11/2013    Procedure: DECOMPRESSIVE LUMBAR LAMINECTOMY L3-L5;  Surgeon: Melina Schools, MD;  Location: [facility];  Service: Orthopedics;  Laterality: N/A;  . Cryotherapy  1984    dysplasia  . Dilation and curettage of uterus  1990    SAB    Current Outpatient Prescriptions  Medication Sig Dispense Refill  . BIOTIN PO Take by mouth daily.    Marland Kitchen CALCIUM PO Take by mouth every other day.    . Cholecalciferol (VITAMIN D PO) Take by mouth  daily.    . folic acid (FOLVITE) 1 MG tablet Take 2 mg by mouth daily.    . methotrexate (RHEUMATREX) 2.5 MG tablet Takes 6 tablets a week    . Multiple Vitamin (MULTIVITAMIN WITH MINERALS) TABS tablet Take 1 tablet by mouth every other day.     . Omega-3 Fatty Acids (FISH OIL PO) Take 1 capsule by mouth daily.     No current facility-administered medications for this visit.    Family History  Problem Relation Age of Onset  . Breast cancer Mother   . Hypertension Mother   . Thyroid disease Mother   . Cancer Father     prostate  . Hypertension Father   . Thyroid disease Father   . Heart attack Maternal Grandmother   . Cancer Maternal Grandfather     blood  . Cancer Paternal Grandmother     stomach    ROS:  Pertinent items are noted in HPI.  Otherwise, a comprehensive ROS was negative.  Exam:   BP 118/78 mmHg  Pulse 70  Resp 16  Ht 5' 4.75" (1.645 m)  Wt 152 lb (68.947 kg)  BMI 25.48 kg/m2  LMP 10/22/1998 Height: 5' 4.75" (164.5 cm) Ht Readings from Last 3 Encounters:  01/12/16 5' 4.75" (1.645 m)  01/10/15 5\' 5"  (1.651  m)  07/22/14 5\' 5"  (1.651 m)    General appearance: alert, cooperative and appears stated age Head: Normocephalic, without obvious abnormality, atraumatic Neck: no adenopathy, supple, symmetrical, trachea midline and thyroid normal to inspection and palpation Lungs: clear to auscultation bilaterally Breasts: normal appearance, no masses or tenderness, No nipple retraction or dimpling, No nipple discharge or bleeding, No axillary or supraclavicular adenopathy Heart: regular rate and rhythm Abdomen: soft, non-tender; no masses,  no organomegaly Extremities: extremities normal, atraumatic, no cyanosis or edema Skin: Skin color, texture, turgor normal. No rashes or lesions Lymph nodes: Cervical, supraclavicular, and axillary nodes normal. No abnormal inguinal nodes palpated Neurologic: Grossly normal   Pelvic: External genitalia:  no lesions               Urethra:  normal appearing urethra with no masses, tenderness or lesions              Bartholin's and Skene's: normal                 Vagina: normal appearing vagina with normal color and discharge, no lesions              Cervix: multiparous appearance, no cervical motion tenderness and no lesions              Pap taken: No. Bimanual Exam:  Uterus:  normal size, contour, position, consistency, mobility, non-tender and anteverted              Adnexa: normal adnexa and no mass, fullness, tenderness               Rectovaginal: Confirms               Anus:  normal sphincter tone, no lesions  Chaperone present: yes  A:  Well Woman with normal exam  Menopausal no HRT  Colonoscopy due  BMD due  Shingles vaccine ?  RA with Rheumatology management  Screening labs  P:   Reviewed health and wellness pertinent to exam  Aware of need to evaluate if vaginal bleeding  Discussed risks/benefits desires referral. Patient will be referred to Dr. Collene Mares and will be called with appointment information.  Patient will check with Rheumatology to see if they are managing if not will call to schedule and advise.  Patient to check to see if she has had this.  Labs: Vitamin D, Lipid panel  Pap smear as above not taken   counseled on breast self exam, mammography screening, adequate intake of calcium and vitamin D, diet and exercise  return annually or prn  An After Visit Summary was printed and given to the patient.

## 2016-01-13 LAB — VITAMIN D 25 HYDROXY (VIT D DEFICIENCY, FRACTURES): Vit D, 25-Hydroxy: 35 ng/mL (ref 30–100)

## 2016-01-18 NOTE — Progress Notes (Signed)
Encounter reviewed Jill Jertson, MD   

## 2016-01-26 ENCOUNTER — Encounter: Payer: Self-pay | Admitting: Gastroenterology

## 2016-04-02 ENCOUNTER — Telehealth: Payer: Self-pay | Admitting: Certified Nurse Midwife

## 2016-04-02 NOTE — Telephone Encounter (Signed)
Patient needing an order for bone density scan faxed to [facility].

## 2016-04-02 NOTE — Telephone Encounter (Signed)
Last annual 01-12-16 with French Ana, CNM. Last BMD 2012.  Order to your desk for review and signature.

## 2016-04-02 NOTE — Telephone Encounter (Signed)
Call to patient. Advised Jacqueline Tucker out of office today, order to be reviewed tomorrow. Will call back if any issues.

## 2016-04-04 NOTE — Telephone Encounter (Signed)
signed

## 2016-04-04 NOTE — Telephone Encounter (Signed)
BMD order faxed to [facility].  

## 2016-04-11 LAB — HM COLONOSCOPY

## 2016-04-17 ENCOUNTER — Telehealth: Payer: Self-pay

## 2016-04-17 NOTE — Telephone Encounter (Signed)
Called patient to give her bmd results. [email or web address]

## 2016-04-17 NOTE — Telephone Encounter (Signed)
Patient notified of results. See lab. Consult appt scheduled with dr Talbert Nan at 10:30 on 04-30-16

## 2016-04-30 ENCOUNTER — Encounter: Payer: Self-pay | Admitting: Obstetrics and Gynecology

## 2016-04-30 ENCOUNTER — Ambulatory Visit (INDEPENDENT_AMBULATORY_CARE_PROVIDER_SITE_OTHER): Payer: Medicare Other | Admitting: Obstetrics and Gynecology

## 2016-04-30 VITALS — BP 122/60 | HR 64 | Resp 15 | Wt 155.0 lb

## 2016-04-30 DIAGNOSIS — M810 Age-related osteoporosis without current pathological fracture: Secondary | ICD-10-CM

## 2016-04-30 LAB — CBC
HCT: 40.5 % (ref 35.0–45.0)
Hemoglobin: 13.6 g/dL (ref 11.7–15.5)
MCH: 31.2 pg (ref 27.0–33.0)
MCHC: 33.6 g/dL (ref 32.0–36.0)
MCV: 92.9 fL (ref 80.0–100.0)
MPV: 9.9 fL (ref 7.5–12.5)
PLATELETS: 193 10*3/uL (ref 140–400)
RBC: 4.36 MIL/uL (ref 3.80–5.10)
RDW: 14 % (ref 11.0–15.0)
WBC: 5.6 10*3/uL (ref 3.8–10.8)

## 2016-04-30 MED ORDER — ALENDRONATE SODIUM 70 MG PO TABS
70.0000 mg | ORAL_TABLET | ORAL | Status: DC
Start: 2016-04-30 — End: 2018-01-16

## 2016-04-30 NOTE — Patient Instructions (Signed)
Today we did the following lab work: CBC, CMP, Phos. Osteoporosis Osteoporosis is the thinning and loss of density in the bones. Osteoporosis makes the bones more brittle, fragile, and likely to break (fracture). Over time, osteoporosis can cause the bones to become so weak that they fracture after a simple fall. The bones most likely to fracture are the bones in the hip, wrist, and spine. CAUSES  The exact cause is not known. RISK FACTORS Anyone can develop osteoporosis. You may be at greater risk if you have a family history of the condition or have poor nutrition. You may also have a higher risk if you are:   Female.   60 years old or older.  A smoker.  Not physically active.   White or Asian.  Slender. SIGNS AND SYMPTOMS  A fracture might be the first sign of the disease, especially if it results from a fall or injury that would not usually cause a bone to break. Other signs and symptoms include:   Low back and neck pain.  Stooped posture.  Height loss. DIAGNOSIS  To make a diagnosis, your health care provider may:  Take a medical history.  Perform a physical exam.  Order tests, such as:  A bone mineral density test.  A dual-energy X-ray absorptiometry test. TREATMENT  The goal of osteoporosis treatment is to strengthen your bones to reduce your risk of a fracture. Treatment may involve:  Making lifestyle changes, such as:  Eating a diet rich in calcium.  Doing weight-bearing and muscle-strengthening exercises.  Stopping tobacco use.  Limiting alcohol intake.  Taking medicine to slow the process of bone loss or to increase bone density.  Monitoring your levels of calcium and vitamin D. HOME CARE INSTRUCTIONS  Include calcium and vitamin D in your diet. Calcium is important for bone health, and vitamin D helps the body absorb calcium.  Perform weight-bearing and muscle-strengthening exercises as directed by your health care provider.  Do not use any  tobacco products, including cigarettes, chewing tobacco, and electronic cigarettes. If you need help quitting, ask your health care provider.  Limit your alcohol intake.  Take medicines only as directed by your health care provider.  Keep all follow-up visits as directed by your health care provider. This is important.  Take precautions at home to lower your risk of falling, such as:  Keeping rooms well lit and clutter free.  Installing safety rails on stairs.  Using rubber mats in the bathroom and other areas that are often wet or slippery. SEEK IMMEDIATE MEDICAL CARE IF:  You fall or injure yourself.    This information is not intended to replace advice given to you by your health care provider. Make sure you discuss any questions you have with your health care provider.   Document Released: 07/18/2005 Document Revised: 10/29/2014 Document Reviewed: 03/18/2014 Elsevier Interactive Patient Education [employer].

## 2016-04-30 NOTE — Progress Notes (Signed)
GYNECOLOGY  VISIT   HPI: 65 y.o.   Married  Caucasian  female   [phone] with Patient's last menstrual period was 10/22/1998.   here for DEXA consult. She has osteoporosis on recent DEXA (both femurs and spine). She is on vit d, getting about 1,200 mg of calcium a day. She walks every day, yoga 2 x a week. FH of osteoporosis in her mother, no hip fractures.  She was on anti-inflammatory medications, had to stop secondary to elevated LFT's (mildly elevated, improved off meds). No reflux issues.   GYNECOLOGIC HISTORY: Patient's last menstrual period was 10/22/1998. Contraception:postmenopause Menopausal hormone therapy: none         OB History    Gravida Para Term Preterm AB TAB SAB Ectopic Multiple Living   3 1 1  2 1 1   1          Patient Active Problem List   Diagnosis Date Noted  . Back pain 11/11/2013    Past Medical History  Diagnosis Date  . Complication of anesthesia   . PONV (postoperative nausea and vomiting)   . Cold (disease)     getting over cold  . Headache(784.0)     migraines  . Arthritis   . Rheumatoid arthritis ([place])   . Abnormal Pap smear of cervix 10/99    LGSIL  . Osteoporosis     Past Surgical History  Procedure Laterality Date  . Wisdom tooth extraction  1968  . Lumbar laminectomy  11/11/2013    L3   L5     DR BROOKS  . Decompressive lumbar laminectomy level 2 N/A 11/11/2013    Procedure: DECOMPRESSIVE LUMBAR LAMINECTOMY L3-L5;  Surgeon: Melina Schools, MD;  Location: [facility];  Service: Orthopedics;  Laterality: N/A;  . Cryotherapy  1984    dysplasia  . Dilation and curettage of uterus  1990    SAB    Current Outpatient Prescriptions  Medication Sig Dispense Refill  . BIOTIN PO Take by mouth daily.    Marland Kitchen CALCIUM PO Take by mouth every other day.    . Cholecalciferol (VITAMIN D PO) Take by mouth daily.    . folic acid (FOLVITE) 1 MG tablet Take 2 mg by mouth daily.    . methotrexate (RHEUMATREX) 2.5 MG tablet Takes 6 tablets a week    .  Multiple Vitamin (MULTIVITAMIN WITH MINERALS) TABS tablet Take 1 tablet by mouth every other day.     . Omega-3 Fatty Acids (FISH OIL PO) Take 1 capsule by mouth daily.     No current facility-administered medications for this visit.     ALLERGIES: Codeine and Latex  Family History  Problem Relation Age of Onset  . Breast cancer Mother   . Hypertension Mother   . Thyroid disease Mother   . Cancer Father     prostate  . Hypertension Father   . Thyroid disease Father   . Heart attack Maternal Grandmother   . Cancer Maternal Grandfather     blood  . Cancer Paternal Grandmother     stomach    Social History   Social History  . Marital Status: Married    Spouse Name: N/A  . Number of Children: N/A  . Years of Education: N/A   Occupational History  . Not on file.   Social History Main Topics  . Smoking status: Never Smoker   . Smokeless tobacco: Never Used  . Alcohol Use: No  . Drug Use: No  . Sexual Activity:  Partners: Male    Patent examiner Protection: Post-menopausal   Other Topics Concern  . Not on file   Social History Narrative    Review of Systems  Constitutional: Negative.   HENT: Negative.   Eyes: Negative.   Respiratory: Negative.   Cardiovascular: Negative.   Gastrointestinal: Negative.   Genitourinary: Negative.   Musculoskeletal: Negative.   Skin: Negative.   Neurological: Negative.   Endo/Heme/Allergies: Negative.   Psychiatric/Behavioral: Negative.     PHYSICAL EXAMINATION:    BP 122/60 mmHg  Pulse 64  Resp 15  Wt 155 lb (70.308 kg)  LMP 10/22/1998    General appearance: alert, cooperative and appears stated age  ASSESSMENT Osteoporosis, discussed options of treatment, including: biphosphonate, prolia and Evista. Given her RA and use of methotrexate, prolia may not be the best choice. Recommended Fosamax. Risks of medications reviewed Rheumatoid arthritis on methotrexate    PLAN Continue calcium, vit D, weight bearing  exercise CBC, CMP, Phosphorous   An After Visit Summary was printed and given to the patient.  20 minutes face to face time of which over 50% was spent in counseling.   CC: Dr Gavin Pound

## 2016-05-01 LAB — COMPREHENSIVE METABOLIC PANEL
ALT: 27 U/L (ref 6–29)
AST: 22 U/L (ref 10–35)
Albumin: 4.5 g/dL (ref 3.6–5.1)
Alkaline Phosphatase: 64 U/L (ref 33–130)
BUN: 19 mg/dL (ref 7–25)
CALCIUM: 9.4 mg/dL (ref 8.6–10.4)
CHLORIDE: 102 mmol/L (ref 98–110)
CO2: 26 mmol/L (ref 20–31)
Creat: 0.76 mg/dL (ref 0.50–0.99)
GLUCOSE: 84 mg/dL (ref 65–99)
POTASSIUM: 4.2 mmol/L (ref 3.5–5.3)
Sodium: 139 mmol/L (ref 135–146)
Total Bilirubin: 0.6 mg/dL (ref 0.2–1.2)
Total Protein: 6.8 g/dL (ref 6.1–8.1)

## 2016-05-01 LAB — PHOSPHORUS: PHOSPHORUS: 3.7 mg/dL (ref 2.1–4.3)

## 2016-10-03 ENCOUNTER — Encounter: Payer: Self-pay | Admitting: Certified Nurse Midwife

## 2017-01-15 ENCOUNTER — Ambulatory Visit (INDEPENDENT_AMBULATORY_CARE_PROVIDER_SITE_OTHER): Payer: Medicare Other | Admitting: Certified Nurse Midwife

## 2017-01-15 ENCOUNTER — Encounter: Payer: Self-pay | Admitting: Certified Nurse Midwife

## 2017-01-15 VITALS — BP 108/64 | HR 68 | Resp 16 | Ht 64.75 in | Wt 149.0 lb

## 2017-01-15 DIAGNOSIS — Z23 Encounter for immunization: Secondary | ICD-10-CM | POA: Diagnosis not present

## 2017-01-15 DIAGNOSIS — Z01419 Encounter for gynecological examination (general) (routine) without abnormal findings: Secondary | ICD-10-CM

## 2017-01-15 DIAGNOSIS — Z124 Encounter for screening for malignant neoplasm of cervix: Secondary | ICD-10-CM

## 2017-01-15 DIAGNOSIS — M810 Age-related osteoporosis without current pathological fracture: Secondary | ICD-10-CM | POA: Diagnosis not present

## 2017-01-15 DIAGNOSIS — Z0000 Encounter for general adult medical examination without abnormal findings: Secondary | ICD-10-CM

## 2017-01-15 LAB — TSH: TSH: 1.59 mIU/L

## 2017-01-15 NOTE — Patient Instructions (Signed)

## 2017-01-15 NOTE — Progress Notes (Signed)
66 y.o. [id number] Married  Caucasian Fe here for annual exam.  Menopausal no HRT. Denies vaginal bleeding or vaginal dryness. Sees Rheumatology for RA and now on a new anti-inflammatory medication. Patient has noted increasing generalized pain in legs. She notes this began after she started  Fosamax for osteoporosis. Discussed concerns with increase joint pain with Rheumatologist, to see if RA related. She felt it was possible. Patient would like to take a Fosamax holiday for up to 3 months to see if pain resolves. She is also aware it could be RA, but does not think so.  BMD due in 2018. She will take her calcium and Vitamin D and remain in exercise during this time. Sees PCP for Aex/labs, none desired today. No other health issues today. Patient's last menstrual period was 10/22/1998.          Sexually active: Yes.    The current method of family planning is post menopausal status.    Exercising: Yes.    yoga & walking Smoker:  no  Health Maintenance: Pap: 01-10-15 neg HPV HR neg, hx of cryo MMG:  09-07-16 category b density birads 1:neg Colonoscopy:  2017 neg f/u 30yrs due to family history BMD:   2017  osteoporosis TDaP:  2008 Shingles: had done Pneumonia: had done Hep C and HIV: hep c done neg 2017 Labs: pcp  Self breast exam: done occ   reports that she has never smoked. She has never used smokeless tobacco. She reports that she does not drink alcohol or use drugs.  Past Medical History:  Diagnosis Date  . Abnormal Pap smear of cervix 10/99   LGSIL  . Arthritis   . Cold (disease)    getting over cold  . Complication of anesthesia   . Headache(784.0)    migraines  . Osteoporosis   . PONV (postoperative nausea and vomiting)   . Rheumatoid arthritis [facility])     Past Surgical History:  Procedure Laterality Date  . CRYOTHERAPY  1984   dysplasia  . DECOMPRESSIVE LUMBAR LAMINECTOMY LEVEL 2 N/A 11/11/2013   Procedure: DECOMPRESSIVE LUMBAR LAMINECTOMY L3-L5;  Surgeon: Melina Schools,  MD;  Location: [place];  Service: Orthopedics;  Laterality: N/A;  . DILATION AND CURETTAGE OF UTERUS  1990   SAB  . LUMBAR LAMINECTOMY  11/11/2013   L3   L5     DR BROOKS  . WISDOM TOOTH EXTRACTION  1968    Current Outpatient Prescriptions  Medication Sig Dispense Refill  . alendronate (FOSAMAX) 70 MG tablet Take 1 tablet (70 mg total) by mouth every 7 (seven) days. Take with a full glass of water on an empty stomach. 4 tablet 11  . BIOTIN PO Take by mouth daily.    Marland Kitchen CALCIUM PO Take by mouth every other day.    . Cholecalciferol (VITAMIN D PO) Take by mouth daily.    . folic acid (FOLVITE) 1 MG tablet Take 2 mg by mouth daily.    . meloxicam (MOBIC) 15 MG tablet Take 15 mg by mouth as needed for pain.    . methotrexate (RHEUMATREX) 2.5 MG tablet Takes 6 tablets a week    . Multiple Vitamin (MULTIVITAMIN WITH MINERALS) TABS tablet Take 1 tablet by mouth every other day.     . Omega-3 Fatty Acids (FISH OIL PO) Take 1 capsule by mouth daily.     No current facility-administered medications for this visit.     Family History  Problem Relation Age of Onset  . Breast  cancer Mother   . Hypertension Mother   . Thyroid disease Mother   . Cancer Father     prostate  . Hypertension Father   . Thyroid disease Father   . Heart attack Maternal Grandmother   . Cancer Maternal Grandfather     blood  . Cancer Paternal Grandmother     stomach    ROS:  Pertinent items are noted in HPI.  Otherwise, a comprehensive ROS was negative.  Exam:   BP 108/64   Pulse 68   Resp 16   Ht 5' 4.75" (1.645 m)   Wt 149 lb (67.6 kg)   LMP 10/22/1998   BMI 24.99 kg/m  Height: 5' 4.75" (164.5 cm) Ht Readings from Last 3 Encounters:  01/15/17 5' 4.75" (1.645 m)  01/12/16 5' 4.75" (1.645 m)  01/10/15 5\' 5"  (1.651 m)    General appearance: alert, cooperative and appears stated age Head: Normocephalic, without obvious abnormality, atraumatic Neck: no adenopathy, supple, symmetrical, trachea midline and  thyroid normal to inspection and palpation Lungs: clear to auscultation bilaterally Breasts: normal appearance, no masses or tenderness, No nipple retraction or dimpling, No nipple discharge or bleeding, No axillary or supraclavicular adenopathy Heart: regular rate and rhythm Abdomen: soft, non-tender; no masses,  no organomegaly Extremities: extremities normal, atraumatic, no cyanosis or edema Skin: Skin color, texture, turgor normal. No rashes or lesions Lymph nodes: Cervical, supraclavicular, and axillary nodes normal. No abnormal inguinal nodes palpated Neurologic: Grossly normal   Pelvic: External genitalia:  no lesions              Urethra:  normal appearing urethra with no masses, tenderness or lesions              Bartholin's and Skene's: normal                 Vagina: normal appearing vagina with normal color and discharge, no lesions              Cervix: no bleeding following Pap, no cervical motion tenderness and no lesions              Pap taken: Yes.   Bimanual Exam:  Uterus:  normal size, contour, position, consistency, mobility, non-tender              Adnexa: normal adnexa and no mass, fullness, tenderness               Rectovaginal: Confirms               Anus:  normal sphincter tone, no lesions  Chaperone present: yes  A:  Well Woman with normal exam  Menopausal no HRT  Osteoporosis on Fosamax with increase bone pain?  RA with Rheumatology management of Methatrexate  Family History of Breast cancer mother  Immunization due  History of Cryo for abnormal pap ? Etiology  Screening labs  P:   Reviewed health and wellness pertinent to exam  Aware of need to evaluate if vaginal bleeding  Discussed Fosamax holiday and slow bone turnover and feel this appropriate to see if having problems with medication. Patient will call in 6 weeks and advise of status at that point, if resolved may consider another medication. Patient agreeable.  Continue follow up with MD as  indicated  Aware of importance of SBE and mammogram  Requests TDAP  Lab TSH, Vitamin D  Pap smear as above    counseled on breast self exam, mammography screening, osteoporosis, adequate intake of calcium and  vitamin D, diet and exercise  return annually or prn  An After Visit Summary was printed and given to the patient.

## 2017-01-16 LAB — VITAMIN D 25 HYDROXY (VIT D DEFICIENCY, FRACTURES): Vit D, 25-Hydroxy: 42 ng/mL (ref 30–100)

## 2017-01-16 LAB — IPS PAP TEST WITH REFLEX TO HPV

## 2017-01-16 NOTE — Progress Notes (Signed)
Encounter reviewed Jacqueline Parrilla, MD   

## 2017-04-16 ENCOUNTER — Encounter: Payer: Self-pay | Admitting: Certified Nurse Midwife

## 2017-10-07 ENCOUNTER — Encounter: Payer: Self-pay | Admitting: Certified Nurse Midwife

## 2018-01-16 ENCOUNTER — Other Ambulatory Visit: Payer: Self-pay

## 2018-01-16 ENCOUNTER — Encounter: Payer: Self-pay | Admitting: Certified Nurse Midwife

## 2018-01-16 ENCOUNTER — Ambulatory Visit (INDEPENDENT_AMBULATORY_CARE_PROVIDER_SITE_OTHER): Payer: Medicare Other | Admitting: Certified Nurse Midwife

## 2018-01-16 VITALS — BP 130/80 | HR 74 | Resp 16 | Ht 64.5 in | Wt 143.0 lb

## 2018-01-16 DIAGNOSIS — Z01419 Encounter for gynecological examination (general) (routine) without abnormal findings: Secondary | ICD-10-CM

## 2018-01-16 DIAGNOSIS — E559 Vitamin D deficiency, unspecified: Secondary | ICD-10-CM | POA: Diagnosis not present

## 2018-01-16 DIAGNOSIS — N951 Menopausal and female climacteric states: Secondary | ICD-10-CM | POA: Diagnosis not present

## 2018-01-16 DIAGNOSIS — Z8739 Personal history of other diseases of the musculoskeletal system and connective tissue: Secondary | ICD-10-CM

## 2018-01-16 NOTE — Patient Instructions (Signed)
Vitamin D Deficiency Vitamin D deficiency is when your body does not have enough vitamin D. Vitamin D is important because:  It helps your body use other minerals that your body needs.  It helps keep your bones strong and healthy.  It may help to prevent some diseases.  It helps your heart and other muscles work well.  You can get vitamin D by:  Eating foods with vitamin D in them.  Drinking or eating milk or other foods that have had vitamin D added to them.  Taking a vitamin D supplement.  Being in the sun.  Not getting enough vitamin D can make your bones become soft. It can also cause other health problems. Follow these instructions at home:  Take medicines and supplements only as told by your doctor.  Eat foods that have vitamin D. These include: ? Dairy products, cereals, or juices with added vitamin D. Check the label for vitamin D. ? Fatty fish like salmon or trout. ? Eggs. ? Oysters.  Do not use tanning beds.  Stay at a healthy weight. Lose weight, if needed.  Keep all follow-up visits as told by your doctor. This is important. Contact a doctor if:  Your symptoms do not go away.  You feel sick to your stomach (nauseous).  Youthrow up (vomit).  You poop less often than usual or you have trouble pooping (constipation). This information is not intended to replace advice given to you by your health care provider. Make sure you discuss any questions you have with your health care provider. Document Released: 09/27/2011 Document Revised: 03/15/2016 Document Reviewed: 02/23/2015 Elsevier Interactive Patient Education  2018 [employer]  

## 2018-01-16 NOTE — Progress Notes (Signed)
67 y.o. [id number] Married  Caucasian Fe here for annual exam.  Menopausal  No HRT, denies vaginal bleeding. Some vaginal dryness, but not sexually active. Seeing Rheumatology for RA and now on Humira, has infection with initial use. Will also be starting Celebrex soon for pain. Still able to walk 2 miles daily. Stressed with not able to feel better soon, but hopeful this month. Family supportive and helpful. Eating well and trying to get calcium and vitamin D in, continues Prolia for osteoporosis.. No other health issues today.  Patient's last menstrual period was 10/22/1998.          Sexually active: Yes.    The current method of family planning is post menopausal status.    Exercising: Yes.    yoga & walking Smoker:  no  Health Maintenance: Pap:  01-10-15 neg HPV HR neg, 01-15-17 neg History of Abnormal Pap: yes MMG:  09-17-17 category b density birads 1:neg Self Breast exams: no Colonoscopy:  2017 neg f/u 54yrs BMD:   2017 osteoporosis TDaP:  2018 Shingles: had done Pneumonia: had done Hep C and HIV: hep c neg per patient Labs: PCP and Rheumatology   reports that she has never smoked. She has never used smokeless tobacco. She reports that she does not drink alcohol or use drugs.  Past Medical History:  Diagnosis Date  . Abnormal Pap smear of cervix 10/99   LGSIL  . Arthritis   . Cold (disease)    getting over cold  . Complication of anesthesia   . Headache(784.0)    migraines  . Osteoporosis   . PONV (postoperative nausea and vomiting)   . Rheumatoid arthritis [facility])     Past Surgical History:  Procedure Laterality Date  . CRYOTHERAPY  1984   dysplasia  . DECOMPRESSIVE LUMBAR LAMINECTOMY LEVEL 2 N/A 11/11/2013   Procedure: DECOMPRESSIVE LUMBAR LAMINECTOMY L3-L5;  Surgeon: Melina Schools, MD;  Location: [facility];  Service: Orthopedics;  Laterality: N/A;  . DILATION AND CURETTAGE OF UTERUS  1990   SAB  . LUMBAR LAMINECTOMY  11/11/2013   L3   L5     DR BROOKS  . WISDOM TOOTH  EXTRACTION  1968    Current Outpatient Medications  Medication Sig Dispense Refill  . BIOTIN PO Take by mouth daily.    Marland Kitchen CALCIUM PO Take by mouth every other day.    . Cholecalciferol (VITAMIN D PO) Take by mouth daily.    Marland Kitchen denosumab (PROLIA) 60 MG/ML SOLN injection Inject 60 mg into the skin every 6 (six) months. Administer in upper arm, thigh, or abdomen    . folic acid (FOLVITE) 1 MG tablet Take 2 mg by mouth daily.    Marland Kitchen HUMIRA PEN 40 MG/0.4ML PNKT     . hydroxychloroquine (PLAQUENIL) 200 MG tablet     . methotrexate (RHEUMATREX) 2.5 MG tablet Takes 8 tablets a week    . Multiple Vitamin (MULTIVITAMIN WITH MINERALS) TABS tablet Take 1 tablet by mouth every other day.     . Omega-3 Fatty Acids (FISH OIL PO) Take 1 capsule by mouth daily.    Marland Kitchen UNABLE TO FIND prednisone    . celecoxib (CELEBREX) 200 MG capsule      No current facility-administered medications for this visit.     Family History  Problem Relation Age of Onset  . Breast cancer Mother 46  . Hypertension Mother   . Thyroid disease Mother   . Cancer Father        prostate  .  Hypertension Father   . Thyroid disease Father   . Heart attack Maternal Grandmother   . Cancer Maternal Grandfather        blood  . Cancer Paternal Grandmother        stomach    ROS:  Pertinent items are noted in HPI.  Otherwise, a comprehensive ROS was negative.  Exam:   BP 130/80   Pulse 74   Resp 16   Ht 5' 4.5" (1.638 m)   Wt 143 lb (64.9 kg)   LMP 10/22/1998   BMI 24.17 kg/m  Height: 5' 4.5" (163.8 cm) Ht Readings from Last 3 Encounters:  01/16/18 5' 4.5" (1.638 m)  01/15/17 5' 4.75" (1.645 m)  01/12/16 5' 4.75" (1.645 m)    General appearance: alert, cooperative and appears stated age Head: Normocephalic, without obvious abnormality, atraumatic Neck: no adenopathy, supple, symmetrical, trachea midline and thyroid normal to inspection and palpation Lungs: clear to auscultation bilaterally Breasts: normal appearance, no  masses or tenderness, No nipple retraction or dimpling, No nipple discharge or bleeding, No axillary or supraclavicular adenopathy Heart: regular rate and rhythm Abdomen: soft, non-tender; no masses,  no organomegaly Extremities: extremities normal, atraumatic, no cyanosis or edema Skin: Skin color, texture, turgor normal. No rashes or lesions Lymph nodes: Cervical, supraclavicular, and axillary nodes normal. No abnormal inguinal nodes palpated Neurologic: Grossly normal   Pelvic: External genitalia:  no lesions              Urethra:  normal appearing urethra with no masses, tenderness or lesions              Bartholin's and Skene's: normal                 Vagina: normal appearing vagina with normal color and discharge, no lesions              Cervix: no cervical motion tenderness, no lesions and normal appearance              Pap taken: No. Bimanual Exam:  Uterus:  normal size, contour, position, consistency, mobility, non-tender and anteverted              Adnexa: normal adnexa and no mass, fullness, tenderness               Rectovaginal: Confirms               Anus:  normal sphincter tone, no lesions  Chaperone present: yes  A:  Well Woman with normal exam  Menopausal no HRT  Vaginal dryness uses OTC moisturizer if needed  Vitamin D deficiency  Osteoporosis on Prolia with Rheumatology management now  Rheumatoid Arthritis now starting Humira  P:   Reviewed health and wellness pertinent to exam  Aware of need to advise if vaginal bleeding  Discussed dietary intake of Vitamin D sources also, given calcium and vitamin D printed information. Will check Vitamin D today.  Lab: Vitamin D  Continue follow up with MD as indicated  Pap smear: no   counseled on breast self exam, mammography screening, feminine hygiene, adequate intake of calcium and vitamin D, diet and exercise  return annually or prn  An After Visit Summary was printed and given to the patient.

## 2018-01-17 LAB — VITAMIN D 25 HYDROXY (VIT D DEFICIENCY, FRACTURES): VIT D 25 HYDROXY: 40.9 ng/mL (ref 30.0–100.0)

## 2019-02-03 ENCOUNTER — Ambulatory Visit: Payer: Medicare Other | Admitting: Certified Nurse Midwife

## 2019-04-06 ENCOUNTER — Ambulatory Visit: Payer: Medicare Other | Admitting: Certified Nurse Midwife

## 2019-05-07 ENCOUNTER — Other Ambulatory Visit: Payer: Self-pay

## 2019-05-08 NOTE — Progress Notes (Signed)
68 y.o. [id number] Married Caucasian Fe here for annual exam. Post menopausal no HRT. Denies vaginal bleeding. No vaginal dryness, limited sexual activity.[facility] for follow up of RA. Still continues on Prolia for osteoporosis with MD lab management. Emotional stress with spouse heart aorta aneurysm surgery impending. New grandchild! No other health issues today.  Patient's last menstrual period was 10/22/1998.          Sexually active: Yes.    The current method of family planning is post menopausal status.    Exercising: Yes.    walking Smoker:  no  Review of Systems  Constitutional: Negative.   HENT: Negative.   Eyes: Negative.   Respiratory: Negative.   Cardiovascular: Negative.   Gastrointestinal: Negative.   Genitourinary: Negative.   Musculoskeletal: Negative.   Skin: Negative.   Neurological: Negative.   Endo/Heme/Allergies: Negative.   Psychiatric/Behavioral: Negative.     Health Maintenance: Pap:  01-15-17 negative            01-10-15 negative, HR HPV negative  History of Abnormal Pap: yes MMG:  09-23-18 density B/BIRADS 1 negative  Self Breast exams: rarely Colonoscopy:  2017 negative, f/u 5 years  BMD:   2019- Dr. Lenna Gilford TDaP:  01-15-17  Shingles: Zostavax Pneumonia: yes Hep C and HIV: unsure  Labs: PCP & Rheumatologist    reports that she has never smoked. She has never used smokeless tobacco. She reports that she does not drink alcohol or use drugs.  Past Medical History:  Diagnosis Date  . Abnormal Pap smear of cervix 10/99   LGSIL  . Arthritis   . Cold (disease)    getting over cold  . Complication of anesthesia   . Headache(784.0)    migraines  . Osteoporosis   . PONV (postoperative nausea and vomiting)   . Rheumatoid arthritis [facility])     Past Surgical History:  Procedure Laterality Date  . CRYOTHERAPY  1984   dysplasia  . DECOMPRESSIVE LUMBAR LAMINECTOMY LEVEL 2 N/A 11/11/2013   Procedure: DECOMPRESSIVE LUMBAR LAMINECTOMY L3-L5;  Surgeon:  Melina Schools, MD;  Location: [place];  Service: Orthopedics;  Laterality: N/A;  . DILATION AND CURETTAGE OF UTERUS  1990   SAB  . LUMBAR LAMINECTOMY  11/11/2013   L3   L5     DR BROOKS  . WISDOM TOOTH EXTRACTION  1968    Current Outpatient Medications  Medication Sig Dispense Refill  . BIOTIN PO Take by mouth daily.    Marland Kitchen CALCIUM PO Take by mouth every other day.    . celecoxib (CELEBREX) 200 MG capsule     . Cholecalciferol (VITAMIN D PO) Take by mouth daily.    Marland Kitchen denosumab (PROLIA) 60 MG/ML SOLN injection Inject 60 mg into the skin every 6 (six) months. Administer in upper arm, thigh, or abdomen    . folic acid (FOLVITE) 1 MG tablet Take 2 mg by mouth daily.    . hydroxychloroquine (PLAQUENIL) 200 MG tablet     . methotrexate (RHEUMATREX) 2.5 MG tablet Takes 8 tablets a week    . Multiple Vitamin (MULTIVITAMIN WITH MINERALS) TABS tablet Take 1 tablet by mouth every other day.     . Omega-3 Fatty Acids (FISH OIL PO) Take 1 capsule by mouth daily.    Marland Kitchen RINVOQ 15 MG TB24      No current facility-administered medications for this visit.     Family History  Problem Relation Age of Onset  . Breast cancer Mother 77  . Hypertension  Mother   . Thyroid disease Mother   . Cancer Father        prostate  . Hypertension Father   . Thyroid disease Father   . Heart attack Maternal Grandmother   . Cancer Maternal Grandfather        blood  . Cancer Paternal Grandmother        stomach    ROS:  Pertinent items are noted in HPI.  Otherwise, a comprehensive ROS was negative.  Exam:   BP 120/84 (BP Location: Right Arm, Patient Position: Sitting, Cuff Size: Normal)   Pulse 72   Temp 98.4 F (36.9 C) (Temporal)   Resp 14   Ht 5' 4.5" (1.638 m)   Wt 149 lb 4 oz (67.7 kg)   LMP 10/22/1998   BMI 25.22 kg/m  Height: 5' 4.5" (163.8 cm) Ht Readings from Last 3 Encounters:  05/11/19 5' 4.5" (1.638 m)  01/16/18 5' 4.5" (1.638 m)  01/15/17 5' 4.75" (1.645 m)    General appearance: alert,  cooperative and appears stated age Head: Normocephalic, without obvious abnormality, atraumatic Neck: no adenopathy, supple, symmetrical, trachea midline and thyroid normal to inspection and palpation Lungs: clear to auscultation bilaterally Breasts: normal appearance, no masses or tenderness, No nipple retraction or dimpling, No nipple discharge or bleeding, No axillary or supraclavicular adenopathy Heart: regular rate and rhythm Abdomen: soft, non-tender; no masses,  no organomegaly Extremities: extremities normal, atraumatic, no cyanosis or edema Skin: Skin color, texture, turgor normal. No rashes or lesions Lymph nodes: Cervical, supraclavicular, and axillary nodes normal. No abnormal inguinal nodes palpated Neurologic: Grossly normal   Pelvic: External genitalia:  no lesions              Urethra:  normal appearing urethra with no masses, tenderness or lesions              Bartholin's and Skene's: normal                 Vagina: normal appearing vagina with normal color and discharge, no lesions              Cervix: no cervical motion tenderness, no lesions and normal appearance              Pap taken: Yes.   Bimanual Exam:  Uterus:  normal size, contour, position, consistency, mobility, non-tender, anteverted and tilts to left              Adnexa: normal adnexa and no mass, fullness, tenderness               Rectovaginal: Confirms               Anus:  normal sphincter tone, no lesions  Chaperone present: yes  A:  Well Woman with normal exam  Post menopausal on HRT  RA, Osteoporosis(on Prolia), labs with MD management, all stable per patient  Emotional stress with spouse heart issue  P:   Reviewed health and wellness pertinent to exam  Aware of need to advise if vaginal bleeding  Continue follow up with PCP as indicated.  Seek family support as needed.  Lab: Vitamin D  Pap smear: yes   counseled on breast self exam, mammography screening, feminine hygiene, osteoporosis,  adequate intake of calcium and vitamin D, diet and exercise  return annually or prn  An After Visit Summary was printed and given to the patient.

## 2019-05-11 ENCOUNTER — Other Ambulatory Visit (HOSPITAL_COMMUNITY)
Admission: RE | Admit: 2019-05-11 | Discharge: 2019-05-11 | Disposition: A | Discharge status: Home / Self Care | Payer: Medicare Other | Source: Ambulatory Visit | Attending: Obstetrics & Gynecology | Admitting: Obstetrics & Gynecology

## 2019-05-11 ENCOUNTER — Other Ambulatory Visit: Payer: Self-pay

## 2019-05-11 ENCOUNTER — Encounter: Payer: Self-pay | Admitting: Certified Nurse Midwife

## 2019-05-11 ENCOUNTER — Ambulatory Visit (INDEPENDENT_AMBULATORY_CARE_PROVIDER_SITE_OTHER): Payer: Medicare Other | Admitting: Certified Nurse Midwife

## 2019-05-11 VITALS — BP 120/84 | HR 72 | Temp 98.4°F | Resp 14 | Ht 64.5 in | Wt 149.2 lb

## 2019-05-11 DIAGNOSIS — E559 Vitamin D deficiency, unspecified: Secondary | ICD-10-CM | POA: Diagnosis not present

## 2019-05-11 DIAGNOSIS — Z780 Asymptomatic menopausal state: Secondary | ICD-10-CM | POA: Insufficient documentation

## 2019-05-11 DIAGNOSIS — Z01419 Encounter for gynecological examination (general) (routine) without abnormal findings: Secondary | ICD-10-CM

## 2019-05-11 DIAGNOSIS — Z1151 Encounter for screening for human papillomavirus (HPV): Secondary | ICD-10-CM | POA: Insufficient documentation

## 2019-05-11 DIAGNOSIS — Z124 Encounter for screening for malignant neoplasm of cervix: Secondary | ICD-10-CM

## 2019-05-11 DIAGNOSIS — Z8739 Personal history of other diseases of the musculoskeletal system and connective tissue: Secondary | ICD-10-CM | POA: Diagnosis not present

## 2019-05-13 LAB — VITAMIN D 25 HYDROXY (VIT D DEFICIENCY, FRACTURES): Vit D, 25-Hydroxy: 36.3 ng/mL (ref 30.0–100.0)

## 2019-05-13 LAB — CYTOLOGY - PAP
Diagnosis: NEGATIVE
HPV: NOT DETECTED

## 2019-10-06 ENCOUNTER — Encounter: Payer: Self-pay | Admitting: Certified Nurse Midwife

## 2019-10-09 ENCOUNTER — Encounter: Payer: Self-pay | Admitting: Certified Nurse Midwife

## 2019-11-30 ENCOUNTER — Ambulatory Visit: Payer: Medicare PPO | Attending: Internal Medicine

## 2019-11-30 DIAGNOSIS — Z23 Encounter for immunization: Secondary | ICD-10-CM

## 2019-11-30 NOTE — Progress Notes (Signed)
   Covid-19 Vaccination Clinic  Name:  Jacqueline Tucker    MRN: [id number] DOB: 10/07/1951  11/30/2019  Ms. Jacqueline Tucker was observed post Covid-19 immunization for 15 minutes without incidence. She was provided with Vaccine Information Sheet and instruction to access the V-Safe system.   Ms. Jacqueline Tucker was instructed to call 911 with any severe reactions post vaccine: Marland Kitchen Difficulty breathing  . Swelling of your face and throat  . A fast heartbeat  . A bad rash all over your body  . Dizziness and weakness    Immunizations Administered    Name Date Dose VIS Date Route   Pfizer COVID-19 Vaccine 11/30/2019  1:57 PM 0.3 mL 10/02/2019 Intramuscular   Manufacturer: [place]   Lot: [id number]   [place]: [id number]

## 2019-12-19 ENCOUNTER — Ambulatory Visit: Payer: BC Managed Care – PPO

## 2019-12-24 ENCOUNTER — Ambulatory Visit: Payer: Medicare PPO | Attending: Internal Medicine

## 2019-12-24 DIAGNOSIS — Z23 Encounter for immunization: Secondary | ICD-10-CM | POA: Insufficient documentation

## 2019-12-24 NOTE — Progress Notes (Signed)
   Covid-19 Vaccination Clinic  Name:  Jacqueline Tucker    MRN: [id number] DOB: July 01, 1951  12/24/2019  [name] was observed post Covid-19 immunization for 15 minutes without incident. She was provided with Vaccine Information Sheet and instruction to access the V-Safe system.   [name] was instructed to call 911 with any severe reactions post vaccine: Marland Kitchen Difficulty breathing  . Swelling of face and throat  . A fast heartbeat  . A bad rash all over body  . Dizziness and weakness   Immunizations Administered    Name Date Dose VIS Date Route   Pfizer COVID-19 Vaccine 12/24/2019  9:50 AM 0.3 mL 10/02/2019 Intramuscular   Manufacturer: [place]   Lot: [id number]   [place]: [id number]

## 2020-01-12 ENCOUNTER — Encounter: Payer: Self-pay | Admitting: Certified Nurse Midwife

## 2020-02-01 ENCOUNTER — Ambulatory Visit: Payer: Medicare PPO | Attending: Internal Medicine

## 2020-02-01 DIAGNOSIS — Z20822 Contact with and (suspected) exposure to covid-19: Secondary | ICD-10-CM

## 2020-02-03 LAB — SARS-COV-2, NAA 2 DAY TAT

## 2020-02-03 LAB — NOVEL CORONAVIRUS, NAA: SARS-CoV-2, NAA: NOT DETECTED

## 2020-05-16 NOTE — Progress Notes (Signed)
69 y.o. [id number] Married Caucasian female here for annual exam.    No vaginal bleeding or spotting.  No bladder or bowel control issues.   Waiting to schedule left hip replacement.  She thinks she is taking vit D 5000 IU daily.  States this level is followed through this office.   Has an 16 month old grandchild.  Husband had open heart surgery.   She and her husband have had Covid vaccines.   PCP: Milagros Evener, MD   Patient's last menstrual period was 10/22/1998.           Sexually active: Yes.   husband with open heart surgery 12-21-19 The current method of family planning is post menopausal status.    Exercising: No.  Walking and yoga.  Needs hip replacement.  Smoker:  no  Health Maintenance: Pap: 05-11-19 Neg:New HR HPV, 01-15-17 Neg, 01-10-15 Neg:Neg HR HPV History of abnormal Pap: Yes, 1984 Hx of cryotherapy to cervix.  Hx of atypical paps following this.  MMG: 10-06-19 3DLt.Br.with oval asymmetry;Rt.Br.Neg--Diag.Lt.Br.focal area in question in Lt.Br.in prior films not reproduced and presumably represents superimposed breast tissue/Neg/BiRads2 Colonoscopy: 2017 normal;next 2022 BMD: 2019  Result :Osteoporosis with Dr.Hawkes--on Prolia TDaP: 01-15-17 Gardasil:   no MLY:YTKPTW Hep C:Unsure Screening Labs:  PCP.   reports that she has never smoked. She has never used smokeless tobacco. She reports that she does not drink alcohol and does not use drugs.  Past Medical History:  Diagnosis Date  . Abnormal Pap smear of cervix 10/99   LGSIL  . Arthritis   . Cold (disease)    getting over cold  . Complication of anesthesia   . Headache(784.0)    migraines  . Hypertension   . Osteoporosis   . PONV (postoperative nausea and vomiting)   . Rheumatoid arthritis [facility])     Past Surgical History:  Procedure Laterality Date  . CRYOTHERAPY  1984   dysplasia  . DECOMPRESSIVE LUMBAR LAMINECTOMY LEVEL 2 N/A 11/11/2013   Procedure: DECOMPRESSIVE LUMBAR LAMINECTOMY L3-L5;   Surgeon: Melina Schools, MD;  Location: [place];  Service: Orthopedics;  Laterality: N/A;  . DILATION AND CURETTAGE OF UTERUS  1990   SAB  . LUMBAR LAMINECTOMY  11/11/2013   L3   L5     DR BROOKS  . WISDOM TOOTH EXTRACTION  1968    Current Outpatient Medications  Medication Sig Dispense Refill  . BIOTIN PO Take by mouth daily.    . celecoxib (CELEBREX) 200 MG capsule     . Cholecalciferol (VITAMIN D PO) Take 5,000 Units by mouth daily.     Marland Kitchen denosumab (PROLIA) 60 MG/ML SOLN injection Inject 60 mg into the skin every 6 (six) months. Administer in upper arm, thigh, or abdomen    . folic acid (FOLVITE) 1 MG tablet Take 2 mg by mouth daily.    Marland Kitchen lisinopril (ZESTRIL) 10 MG tablet Take 1 tablet by mouth daily.    . methotrexate (RHEUMATREX) 2.5 MG tablet Takes 8 tablets a week    . Multiple Vitamin (MULTIVITAMIN WITH MINERALS) TABS tablet Take 1 tablet by mouth every other day.     . Omega-3 Fatty Acids (FISH OIL PO) Take 1 capsule by mouth daily.    Marland Kitchen RINVOQ 15 MG TB24      No current facility-administered medications for this visit.    Family History  Problem Relation Age of Onset  . Breast cancer Mother 23  . Hypertension Mother   . Thyroid disease Mother   .  Cancer Mother        ocular melanoma  . Cancer Father        prostate  . Hypertension Father   . Thyroid disease Father   . Heart attack Maternal Grandmother   . Cancer Maternal Grandfather        blood  . Cancer Paternal Grandmother        stomach    Review of Systems  All other systems reviewed and are negative.   Exam:   BP 124/72   Pulse 76   Resp 16   Ht 5\' 4"  (1.626 m)   Wt 151 lb 9.6 oz (68.8 kg)   LMP 10/22/1998   BMI 26.02 kg/m     General appearance: alert, cooperative and appears stated age Head: normocephalic, without obvious abnormality, atraumatic Neck: no adenopathy, supple, symmetrical, trachea midline and thyroid normal to inspection and palpation Lungs: clear to auscultation  bilaterally Breasts: normal appearance, no masses or tenderness, No nipple retraction or dimpling, No nipple discharge or bleeding, No axillary adenopathy Heart: regular rate and rhythm Abdomen: soft, non-tender; no masses, no organomegaly Extremities: extremities normal, atraumatic, no cyanosis or edema Skin: skin color, texture, turgor normal. No rashes or lesions Lymph nodes: cervical, supraclavicular, and axillary nodes normal. Neurologic: grossly normal  Pelvic: External genitalia:  no lesions              No abnormal inguinal nodes palpated.              Urethra:  normal appearing urethra with no masses, tenderness or lesions              Bartholins and Skenes: normal                 Vagina: normal appearing vagina with normal color and discharge, no lesions              Cervix: no lesions              Pap taken: No. Bimanual Exam:  Uterus:  normal size, contour, position, consistency, mobility, non-tender              Adnexa: no mass, fullness, tenderness              Rectal exam: Yes.  .  Confirms.              Anus:  normal sphincter tone, no lesions  Chaperone was present for exam.  Assessment:   Well woman visit with normal exam. Osteoporosis.  On Prolia.  Dr. Trudie Reed.  Remote hx LGSIL and cryotherapy to cervix.  FH breast cancer in mother.   Plan: Mammogram screening discussed. Self breast awareness reviewed. Pap and HR HPV as above. Guidelines for Calcium, Vitamin D, regular exercise program including cardiovascular and weight bearing exercise. Check vit D level.  Will get copy of BMD. Follow up annually and prn.   After visit summary provided.

## 2020-05-17 ENCOUNTER — Ambulatory Visit (INDEPENDENT_AMBULATORY_CARE_PROVIDER_SITE_OTHER): Payer: Medicare PPO | Admitting: Obstetrics and Gynecology

## 2020-05-17 ENCOUNTER — Ambulatory Visit: Payer: Medicare Other | Admitting: Certified Nurse Midwife

## 2020-05-17 ENCOUNTER — Other Ambulatory Visit: Payer: Self-pay

## 2020-05-17 ENCOUNTER — Encounter: Payer: Self-pay | Admitting: Obstetrics and Gynecology

## 2020-05-17 VITALS — BP 124/72 | HR 76 | Resp 16 | Ht 64.0 in | Wt 151.6 lb

## 2020-05-17 DIAGNOSIS — Z01419 Encounter for gynecological examination (general) (routine) without abnormal findings: Secondary | ICD-10-CM | POA: Diagnosis not present

## 2020-05-17 DIAGNOSIS — M810 Age-related osteoporosis without current pathological fracture: Secondary | ICD-10-CM | POA: Diagnosis not present

## 2020-05-17 NOTE — Patient Instructions (Signed)

## 2020-05-18 LAB — VITAMIN D 25 HYDROXY (VIT D DEFICIENCY, FRACTURES): Vit D, 25-Hydroxy: 94.4 ng/mL (ref 30.0–100.0)

## 2020-06-02 DIAGNOSIS — H52203 Unspecified astigmatism, bilateral: Secondary | ICD-10-CM | POA: Diagnosis not present

## 2020-06-02 DIAGNOSIS — H43813 Vitreous degeneration, bilateral: Secondary | ICD-10-CM | POA: Diagnosis not present

## 2020-06-02 DIAGNOSIS — H04123 Dry eye syndrome of bilateral lacrimal glands: Secondary | ICD-10-CM | POA: Diagnosis not present

## 2020-06-06 DIAGNOSIS — M2550 Pain in unspecified joint: Secondary | ICD-10-CM | POA: Diagnosis not present

## 2020-06-06 DIAGNOSIS — E663 Overweight: Secondary | ICD-10-CM | POA: Diagnosis not present

## 2020-06-06 DIAGNOSIS — Z79899 Other long term (current) drug therapy: Secondary | ICD-10-CM | POA: Diagnosis not present

## 2020-06-06 DIAGNOSIS — M0579 Rheumatoid arthritis with rheumatoid factor of multiple sites without organ or systems involvement: Secondary | ICD-10-CM | POA: Diagnosis not present

## 2020-06-06 DIAGNOSIS — M810 Age-related osteoporosis without current pathological fracture: Secondary | ICD-10-CM | POA: Diagnosis not present

## 2020-06-06 DIAGNOSIS — M150 Primary generalized (osteo)arthritis: Secondary | ICD-10-CM | POA: Diagnosis not present

## 2020-06-06 DIAGNOSIS — Z6825 Body mass index (BMI) 25.0-25.9, adult: Secondary | ICD-10-CM | POA: Diagnosis not present

## 2020-06-08 DIAGNOSIS — M1612 Unilateral primary osteoarthritis, left hip: Secondary | ICD-10-CM | POA: Diagnosis not present

## 2020-06-08 DIAGNOSIS — R9431 Abnormal electrocardiogram [ECG] [EKG]: Secondary | ICD-10-CM | POA: Diagnosis not present

## 2020-06-08 DIAGNOSIS — I10 Essential (primary) hypertension: Secondary | ICD-10-CM | POA: Diagnosis not present

## 2020-06-08 DIAGNOSIS — Z01810 Encounter for preprocedural cardiovascular examination: Secondary | ICD-10-CM | POA: Diagnosis not present

## 2020-06-08 DIAGNOSIS — I7300 Raynaud's syndrome without gangrene: Secondary | ICD-10-CM | POA: Diagnosis not present

## 2020-06-08 DIAGNOSIS — Z01818 Encounter for other preprocedural examination: Secondary | ICD-10-CM | POA: Diagnosis not present

## 2020-06-08 DIAGNOSIS — M069 Rheumatoid arthritis, unspecified: Secondary | ICD-10-CM | POA: Diagnosis not present

## 2020-06-09 ENCOUNTER — Ambulatory Visit: Payer: Medicare PPO | Attending: Internal Medicine

## 2020-06-09 DIAGNOSIS — Z23 Encounter for immunization: Secondary | ICD-10-CM

## 2020-06-09 NOTE — Progress Notes (Signed)
   Covid-19 Vaccination Clinic  Name:  Jacqueline Tucker    MRN: [id number] DOB: 1950-12-25  06/09/2020  Jacqueline Tucker was observed post Covid-19 immunization for 15 minutes without incident. She was provided with Vaccine Information Sheet and instruction to access the V-Safe system.   Jacqueline Tucker was instructed to call 911 with any severe reactions post vaccine: Marland Kitchen Difficulty breathing  . Swelling of face and throat  . A fast heartbeat  . A bad rash all over body  . Dizziness and weakness

## 2020-06-10 DIAGNOSIS — M069 Rheumatoid arthritis, unspecified: Secondary | ICD-10-CM | POA: Diagnosis not present

## 2020-06-10 DIAGNOSIS — R9431 Abnormal electrocardiogram [ECG] [EKG]: Secondary | ICD-10-CM | POA: Diagnosis not present

## 2020-06-10 DIAGNOSIS — Z136 Encounter for screening for cardiovascular disorders: Secondary | ICD-10-CM | POA: Diagnosis not present

## 2020-06-10 DIAGNOSIS — Z01818 Encounter for other preprocedural examination: Secondary | ICD-10-CM | POA: Diagnosis not present

## 2020-06-10 DIAGNOSIS — Z01810 Encounter for preprocedural cardiovascular examination: Secondary | ICD-10-CM | POA: Diagnosis not present

## 2020-06-10 DIAGNOSIS — M1612 Unilateral primary osteoarthritis, left hip: Secondary | ICD-10-CM | POA: Diagnosis not present

## 2020-06-10 DIAGNOSIS — I7300 Raynaud's syndrome without gangrene: Secondary | ICD-10-CM | POA: Diagnosis not present

## 2020-06-10 DIAGNOSIS — I10 Essential (primary) hypertension: Secondary | ICD-10-CM | POA: Diagnosis not present

## 2020-06-13 ENCOUNTER — Encounter: Payer: Self-pay | Admitting: Cardiology

## 2020-06-13 DIAGNOSIS — R9431 Abnormal electrocardiogram [ECG] [EKG]: Secondary | ICD-10-CM | POA: Insufficient documentation

## 2020-06-13 DIAGNOSIS — Z7189 Other specified counseling: Secondary | ICD-10-CM | POA: Insufficient documentation

## 2020-06-13 DIAGNOSIS — M1612 Unilateral primary osteoarthritis, left hip: Secondary | ICD-10-CM | POA: Diagnosis not present

## 2020-06-13 NOTE — Progress Notes (Signed)
Cardiology Office Note   Date:  06/14/2020   ID:  Jacqueline, Tucker [birth year], MRN [id number]  PCP:  Aretta Nip, MD  Cardiologist:   No primary care provider on file. Referring:  Rankins, Bill Salinas, MD  Chief Complaint  Patient presents with  . Abnormal ECG      History of Present Illness: Jacqueline Tucker is a 69 y.o. female who is referred for evaluation of an abnormal EKG.      This was done preop prior to [facility] .  The patient has no past cardiac history.  She did have an ultrasound years ago for shortness of breath and was found ultimately to have panic attacks.  She denies any cardiovascular symptoms.  She is somewhat limited by the hip that needs to be replaced.  She has arthritis.  She has some atypical discomfort "everywhere."  But she does not describe substernal chest pressure, neck or arm discomfort.  She does not get short of breath with activities and she can do a moderate levels of exertional activity (greater than 5 METS).  She does not have resting shortness of breath, PND or orthopnea.  She has rare palpitations but no presyncope or syncope.  She has no weight gain or edema.  Past Medical History:  Diagnosis Date  . Abnormal Pap smear of cervix 10/99   LGSIL  . Arthritis   . Complication of anesthesia   . Headache(784.0)    migraines  . Hypertension   . Osteoporosis   . PONV (postoperative nausea and vomiting)   . Rheumatoid arthritis [facility])     Past Surgical History:  Procedure Laterality Date  . CRYOTHERAPY  1984   dysplasia  . DECOMPRESSIVE LUMBAR LAMINECTOMY LEVEL 2 N/A 11/11/2013   Procedure: DECOMPRESSIVE LUMBAR LAMINECTOMY L3-L5;  Surgeon: Melina Schools, MD;  Location: [place];  Service: Orthopedics;  Laterality: N/A;  . DILATION AND CURETTAGE OF UTERUS  1990   SAB  . LUMBAR LAMINECTOMY  11/11/2013   L3   L5     DR BROOKS  . WISDOM TOOTH EXTRACTION  1968     Current Outpatient Medications  Medication Sig Dispense Refill  . BIOTIN PO  Take by mouth daily.    . celecoxib (CELEBREX) 200 MG capsule     . Cholecalciferol (VITAMIN D PO) Take 5,000 Units by mouth daily.     Marland Kitchen denosumab (PROLIA) 60 MG/ML SOLN injection Inject 60 mg into the skin every 6 (six) months. Administer in upper arm, thigh, or abdomen    . folic acid (FOLVITE) 1 MG tablet Take 2 mg by mouth daily.    Marland Kitchen lisinopril (ZESTRIL) 10 MG tablet Take 1 tablet by mouth daily.    . methotrexate (RHEUMATREX) 2.5 MG tablet Takes 8 tablets a week    . Multiple Vitamin (MULTIVITAMIN WITH MINERALS) TABS tablet Take 1 tablet by mouth every other day.     . Omega-3 Fatty Acids (FISH OIL PO) Take 1 capsule by mouth daily.    Marland Kitchen RINVOQ 15 MG TB24      No current facility-administered medications for this visit.    Allergies:   Codeine, Latex, and Neosporin [neomycin-bacitracin zn-polymyx]    Social History:  The patient  reports that she has never smoked. She has never used smokeless tobacco. She reports that she does not drink alcohol and does not use drugs.   Family History:  The patient's family history includes Breast cancer (age of onset: 62) in her  mother; Cancer in her father, maternal grandfather, mother, and paternal grandmother; Heart attack in her maternal grandmother; Hypertension in her father and mother; Thyroid disease in her father and mother.    ROS:  Please see the history of present illness.   Otherwise, review of systems are positive for none.   All other systems are reviewed and negative.    PHYSICAL EXAM: VS:  BP (!) 146/90   Pulse 75   Ht 5\' 5"  (1.651 m)   Wt 150 lb 3.2 oz (68.1 kg)   LMP 10/22/1998   SpO2 99%   BMI 24.99 kg/m  , BMI Body mass index is 24.99 kg/m. GENERAL:  Well appearing HEENT:  Pupils equal round and reactive, fundi not visualized, oral mucosa unremarkable NECK:  No jugular venous distention, waveform within normal limits, carotid upstroke brisk and symmetric, no bruits, no thyromegaly LYMPHATICS:  No cervical, inguinal  adenopathy LUNGS:  Clear to auscultation bilaterally BACK:  No CVA tenderness CHEST:  Unremarkable HEART:  PMI not displaced or sustained,S1 and S2 within normal limits, no S3, no S4, no clicks, no rubs, no murmurs ABD:  Flat, positive bowel sounds normal in frequency in pitch, no bruits, no rebound, no guarding, no midline pulsatile mass, no hepatomegaly, no splenomegaly EXT:  2 plus pulses throughout, no edema, no cyanosis no clubbing SKIN:  No rashes no nodules NEURO:  Cranial nerves II through XII grossly intact, motor grossly intact throughout PSYCH:  Cognitively intact, oriented to person place and time    EKG:  EKG is ordered today. The ekg ordered today demonstrates sinus rhythm, rate 75, axis within normal limits, intervals within normal limits, no acute ST-T wave changes.   Recent Labs: No results found for requested labs within last 8760 hours.    Lipid Panel    Component Value Date/Time   CHOL 152 01/12/2016 1038   TRIG 67 01/12/2016 1038   HDL 71 01/12/2016 1038   CHOLHDL 2.1 01/12/2016 1038   VLDL 13 01/12/2016 1038   LDLCALC 68 01/12/2016 1038      Wt Readings from Last 3 Encounters:  06/14/20 150 lb 3.2 oz (68.1 kg)  05/17/20 151 lb 9.6 oz (68.8 kg)  05/11/19 149 lb 4 oz (67.7 kg)      Other studies Reviewed: Additional studies/ records that were reviewed today include: Labs and primary care notes. Review of the above records demonstrates:  Please see elsewhere in the note.     ASSESSMENT AND PLAN:  ABNORMAL EKG:    The patient has a normal EKG.  I was able ultimately to review the EKG from her primary care office and thought there was poor anterior R wave progression secondary to lead placement.  However, she has had no history consistent with prior myocardial infarction.  She has no ongoing symptoms.  No further testing is indicated.  HTN: Blood pressure is mildly elevated.  She is to keep an eye on this.   If her blood pressure runs above 130/80 has  had has her goal for her she can increase her lisinopril.  Otherwise no change in therapy.  RISK REDUCTION: She has an excellent lipid profile.  Her LDL is very mildly elevated but her HDL high with a ratio of 80 LDL to HDL 72.  No change in therapy.  COVID EDUCATION: She has been vaccinated.  Current medicines are reviewed at length with the patient today.  The patient does not have concerns regarding medicines.  The following changes have been  made:  no change  Labs/ tests ordered today include: None  Orders Placed This Encounter  Procedures  . EKG 12-Lead     Disposition:   FU with me as needed.      Signed, Minus Breeding, MD  06/14/2020 9:15 AM    [place]

## 2020-06-14 ENCOUNTER — Encounter: Payer: Self-pay | Admitting: Cardiology

## 2020-06-14 ENCOUNTER — Other Ambulatory Visit: Payer: Self-pay

## 2020-06-14 ENCOUNTER — Ambulatory Visit: Payer: Medicare PPO | Admitting: Cardiology

## 2020-06-14 VITALS — BP 146/90 | HR 75 | Ht 65.0 in | Wt 150.2 lb

## 2020-06-14 DIAGNOSIS — Z7189 Other specified counseling: Secondary | ICD-10-CM | POA: Diagnosis not present

## 2020-06-14 DIAGNOSIS — R9431 Abnormal electrocardiogram [ECG] [EKG]: Secondary | ICD-10-CM | POA: Diagnosis not present

## 2020-06-14 NOTE — Patient Instructions (Signed)
Medication Instructions:  No changes *If you need a refill on your cardiac medications before your next appointment, please call your pharmacy*  Lab Work: None ordered this visit  Testing/Procedures: None ordered this visit  Follow-Up: At [facility], you and your health needs are our priority.  As part of our continuing mission to provide you with exceptional heart care, we have created designated Provider Care Teams.  These Care Teams include your primary Cardiologist (physician) and Advanced Practice Providers (APPs -  Physician Assistants and Nurse Practitioners) who all work together to provide you with the care you need, when you need it.    Your next appointment:   Follow up as needed 

## 2020-06-15 NOTE — Patient Instructions (Addendum)
DUE TO COVID-19 ONLY ONE VISITOR IS ALLOWED TO COME WITH YOU AND STAY IN THE WAITING ROOM ONLY DURING PRE OP AND PROCEDURE DAY OF SURGERY. THE 1 VISITOR  MAY VISIT WITH YOU AFTER SURGERY IN YOUR PRIVATE ROOM DURING VISITING HOURS ONLY!  YOU NEED TO HAVE A COVID 19 TEST ON Friday, 06-24-20 @ 9:00 AM ,  THIS TEST MUST BE DONE BEFORE SURGERY,  COVID TESTING SITE [address] [place] [id number], IT IS ON THE RIGHT GOING OUT [address] APPROXIMATELY  2 MINUTES PAST [employer] ON THE RIGHT. ONCE YOUR COVID TEST IS COMPLETED,  PLEASE BEGIN THE QUARANTINE INSTRUCTIONS AS OUTLINED IN YOUR HANDOUT.   Your procedure is scheduled on:  Tuesday, 06-28-20   Report to [facility] Main  Entrance    Report to admitting at 6:25 AM     Call this number if you have problems the morning of surgery [phone]    REMEMBER: NO  SOLID FOOD, CANDY OR GUM AFTER MIDNIGHT.    You May have clear liquids until 5:50 AM          FINISH ENSURE DRINK PER SURGEON ORDER  AT  5:50 AM.    CLEAR LIQUID DIET   Foods Allowed                                                                    Coffee and tea, regular and decaf                            Fruit ices (not with fruit pulp)                                      Iced Popsicles                                    Carbonated beverages, regular and diet                                    Cranberry, grape and apple juices Sports drinks like Gatorade Lightly seasoned clear broth or consume(fat free) Sugar, honey syrup ___________________________________________________________________      BRUSH YOUR TEETH MORNING OF SURGERY AND RINSE YOUR MOUTH OUT, NO CHEWING GUM CANDY OR MINTS.     Take these medicines the morning of surgery with A SIP OF WATER:  None                                You may not have any metal on your body including hair pins and              piercings  Do not wear jewelry, make-up, lotions, powders or perfumes,  deodorant             Do not wear nail polish on your fingernails.  Do not shave  48 hours prior to surgery.  Do not bring valuables to the hospital. [place].   Contacts, dentures or bridgework may not be worn into surgery.     Patients discharged the day of surgery will not be allowed to drive home. IF YOU ARE HAVING SURGERY AND GOING  HOME THE SAME DAY, YOU MUST HAVE AN ADULT TO DRIVE  YOU HOME AND BE WITH YOU FOR 24 HOURS.  YOU MAY GO HOME BY TAXI OR UBER OR ORTHERWISE, BUT AN ADULT MUST ACCOMPANY YOU HOME AND  STAY WITH YOU  FOR 24 HOURS.                Please read over the following fact sheets you were given: _____________________________________________________________________  [facility] - Preparing for Surgery Before surgery, you can play an important role.  Because skin is not sterile, your skin needs to be as free of germs as possible.  You can reduce the number of germs on your skin by washing with CHG (chlorahexidine gluconate) soap before surgery.  CHG is an antiseptic cleaner which kills germs and bonds with the skin to continue killing germs even after washing. Please DO NOT use if you have an allergy to CHG or antibacterial soaps.  If your skin becomes reddened/irritated stop using the CHG and inform your nurse when you arrive at Short Stay. Do not shave (including legs and underarms) for at least 48 hours prior to the first CHG shower.  You may shave your face/neck. Please follow these instructions carefully:  1.  Shower with CHG Soap the night before surgery and the  morning of Surgery.  2.  If you choose to wash your hair, wash your hair first as usual with your  normal  shampoo.  3.  After you shampoo, rinse your hair and body thoroughly to remove the  shampoo.                           4.  Use CHG as you would any other liquid soap.  You can apply chg directly  to the skin and wash                       Gently with a  scrungie or clean washcloth.  5.  Apply the CHG Soap to your body ONLY FROM THE NECK DOWN.   Do not use on face/ open                           Wound or open sores. Avoid contact with eyes, ears mouth and genitals (private parts).                       Wash face,  Genitals (private parts) with your normal soap.             6.  Wash thoroughly, paying special attention to the area where your surgery  will be performed.  7.  Thoroughly rinse your body with warm water from the neck down.  8.  DO NOT shower/wash with your normal soap after using and rinsing off  the CHG Soap.                9.  Pat yourself dry with a clean towel.            10.  Wear clean pajamas.  11.  Place clean sheets on your bed the night of your first shower and do not  sleep with pets. Day of Surgery : Do not apply any lotions/deodorants the morning of surgery.  Please wear clean clothes to the hospital/surgery center.  FAILURE TO FOLLOW THESE INSTRUCTIONS MAY RESULT IN THE CANCELLATION OF YOUR SURGERY PATIENT SIGNATURE_________________________________  NURSE SIGNATURE__________________________________  ________________________________________________________________________     Jacqueline Tucker  An incentive spirometer is a tool that can help keep your lungs clear and active. This tool measures how well you are filling your lungs with each breath. Taking long deep breaths may help reverse or decrease the chance of developing breathing (pulmonary) problems (especially infection) following:  A long period of time when you are unable to move or be active. BEFORE THE PROCEDURE   If the spirometer includes an indicator to show your best effort, your nurse or respiratory therapist will set it to a desired goal.  If possible, sit up straight or lean slightly forward. Try not to slouch.  Hold the incentive spirometer in an upright position. INSTRUCTIONS FOR USE  1. Sit on the edge of your bed if  possible, or sit up as far as you can in bed or on a chair. 2. Hold the incentive spirometer in an upright position. 3. Breathe out normally. 4. Place the mouthpiece in your mouth and seal your lips tightly around it. 5. Breathe in slowly and as deeply as possible, raising the piston or the ball toward the top of the column. 6. Hold your breath for 3-5 seconds or for as long as possible. Allow the piston or ball to fall to the bottom of the column. 7. Remove the mouthpiece from your mouth and breathe out normally. 8. Rest for a few seconds and repeat Steps 1 through 7 at least 10 times every 1-2 hours when you are awake. Take your time and take a few normal breaths between deep breaths. 9. The spirometer may include an indicator to show your best effort. Use the indicator as a goal to work toward during each repetition. 10. After each set of 10 deep breaths, practice coughing to be sure your lungs are clear. If you have an incision (the cut made at the time of surgery), support your incision when coughing by placing a pillow or rolled up towels firmly against it. Once you are able to get out of bed, walk around indoors and cough well. You may stop using the incentive spirometer when instructed by your caregiver.  RISKS AND COMPLICATIONS  Take your time so you do not get dizzy or light-headed.  If you are in pain, you may need to take or ask for pain medication before doing incentive spirometry. It is harder to take a deep breath if you are having pain. AFTER USE  Rest and breathe slowly and easily.  It can be helpful to keep track of a log of your progress. Your caregiver can provide you with a simple table to help with this. If you are using the spirometer at home, follow these instructions: [place] IF:   You are having difficultly using the spirometer.  You have trouble using the spirometer as often as instructed.  Your pain medication is not giving enough relief while using  the spirometer.  You develop fever of 100.5 F (38.1 C) or higher. SEEK IMMEDIATE MEDICAL CARE IF:   You cough up bloody sputum that had not been present before.  You develop fever of 102 F (  38.9 C) or greater.  You develop worsening pain at or near the incision site. MAKE SURE YOU:   Understand these instructions.  Will watch your condition.  Will get help right away if you are not doing well or get worse. Document Released: 02/18/2007 Document Revised: 12/31/2011 Document Reviewed: 04/21/2007 ExitCare Patient Information 2014 Exit[employer], [place].   ________________________________________________________________________   WHAT IS A BLOOD TRANSFUSION? Blood Transfusion Information  A transfusion is the replacement of blood or some of its parts. Blood is made up of multiple cells which provide different functions.  Red blood cells carry oxygen and are used for blood loss replacement.  White blood cells fight against infection.  Platelets control bleeding.  Plasma helps clot blood.  Other blood products are available for specialized needs, such as hemophilia or other clotting disorders. BEFORE THE TRANSFUSION  Who gives blood for transfusions?   Healthy volunteers who are fully evaluated to make sure their blood is safe. This is blood bank blood. Transfusion therapy is the safest it has ever been in the practice of medicine. Before blood is taken from a donor, a complete history is taken to make sure that person has no history of diseases nor engages in risky social behavior (examples are intravenous drug use or sexual activity with multiple partners). The donor's travel history is screened to minimize risk of transmitting infections, such as malaria. The donated blood is tested for signs of infectious diseases, such as HIV and hepatitis. The blood is then tested to be sure it is compatible with you in order to minimize the chance of a transfusion reaction. If you or a relative  donates blood, this is often done in anticipation of surgery and is not appropriate for emergency situations. It takes many days to process the donated blood. RISKS AND COMPLICATIONS Although transfusion therapy is very safe and saves many lives, the main dangers of transfusion include:   Getting an infectious disease.  Developing a transfusion reaction. This is an allergic reaction to something in the blood you were given. Every precaution is taken to prevent this. The decision to have a blood transfusion has been considered carefully by your caregiver before blood is given. Blood is not given unless the benefits outweigh the risks. AFTER THE TRANSFUSION  Right after receiving a blood transfusion, you will usually feel much better and more energetic. This is especially true if your red blood cells have gotten low (anemic). The transfusion raises the level of the red blood cells which carry oxygen, and this usually causes an energy increase.  The nurse administering the transfusion will monitor you carefully for complications. HOME CARE INSTRUCTIONS  No special instructions are needed after a transfusion. You may find your energy is better. Speak with your caregiver about any limitations on activity for underlying diseases you may have. SEEK MEDICAL CARE IF:   Your condition is not improving after your transfusion.  You develop redness or irritation at the intravenous (IV) site. SEEK IMMEDIATE MEDICAL CARE IF:  Any of the following symptoms occur over the next 12 hours:  Shaking chills.  You have a temperature by mouth above 102 F (38.9 C), not controlled by medicine.  Chest, back, or muscle pain.  People around you feel you are not acting correctly or are confused.  Shortness of breath or difficulty breathing.  Dizziness and fainting.  You get a rash or develop hives.  You have a decrease in urine output.  Your urine turns a dark color or changes to  pink, red, or brown. Any  of the following symptoms occur over the next 10 days:  You have a temperature by mouth above 102 F (38.9 C), not controlled by medicine.  Shortness of breath.  Weakness after normal activity.  The white part of the eye turns yellow (jaundice).  You have a decrease in the amount of urine or are urinating less often.  Your urine turns a dark color or changes to pink, red, or brown. Document Released: 10/05/2000 Document Revised: 12/31/2011 Document Reviewed: 05/24/2008 [facility] Patient Information 2014 [place], [place].  _______________________________________________________________________

## 2020-06-15 NOTE — Progress Notes (Addendum)
COVID Vaccine Completed: x2 Date COVID Vaccine completed:  11-30-19 & 12-24-19 COVID vaccine manufacturer: [place]   PCP - Milagros Evener, MD Cardiologist - Minus Breeding.  Last OV 06-14-20 in Epic, to f/u prn  Chest x-ray -  EKG - 06-14-20 in Epic Stress Test -  ECHO -  Cardiac Cath -   Sleep Study -  CPAP -   Fasting Blood Sugar -  Checks Blood Sugar _____ times a day  Blood Thinner Instructions: Aspirin Instructions: Last Dose:  Anesthesia review:   Patient denies shortness of breath, fever, cough and chest pain at PAT appointment   Patient verbalized understanding of instructions that were given to them at the PAT appointment. Patient was also instructed that they will need to review over the PAT instructions again at home before surgery.

## 2020-06-19 NOTE — H&P (Signed)
TOTAL HIP ADMISSION H&P  Patient is admitted for left total hip arthroplasty.  Subjective:  Chief Complaint: Left hip OA / pain  HPI: Jacqueline Tucker, 69 y.o. female, has a history of pain and functional disability in the left hip(s) due to arthritis and patient has failed non-surgical conservative treatments for greater than 12 weeks to include NSAID's and/or analgesics, corticosteriod injections, use of assistive devices and activity modification.  Onset of symptoms was gradual starting 2+ years ago with gradually worsening course since that time.The patient noted no past surgery on the left hip(s).  Patient currently rates pain in the left hip at 8 out of 10 with activity. Patient has worsening of pain with activity and weight bearing, trendelenberg gait, pain that interfers with activities of daily living and pain with passive range of motion. Patient has evidence of periarticular osteophytes and joint space narrowing by imaging studies. This condition presents safety issues increasing the risk of falls.  There is no current active infection.  Risks, benefits and expectations were discussed with the patient.  Risks including but not limited to the risk of anesthesia, blood clots, nerve damage, blood vessel damage, failure of the prosthesis, infection and up to and including death.  Patient understand the risks, benefits and expectations and wishes to proceed with surgery.   PCP: Aretta Nip, MD  D/C Plans:       Home (SD)  Post-op Meds:       Rx given for ASA, Robaxin, Tramadol, Iron, Colace and MiraLax  Tranexamic Acid:      To be given - IV   Decadron:      Is to be given  FYI:     ASA  APAP and tramadol  DME:   Rx sent for - RW   PT:   HEP  Pharmacy: [facility]    Patient Active Problem List   Diagnosis Date Noted   Educated about COVID-19 virus infection 06/13/2020   Nonspecific abnormal electrocardiogram (ECG) (EKG) 06/13/2020   Past Medical History:  Diagnosis  Date   Abnormal Pap smear of cervix 10/99   LGSIL   Arthritis    Complication of anesthesia    Headache(784.0)    migraines   Hypertension    Osteoporosis    PONV (postoperative nausea and vomiting)    Rheumatoid arthritis ([place])     Past Surgical History:  Procedure Laterality Date   CRYOTHERAPY  1984   dysplasia   DECOMPRESSIVE LUMBAR LAMINECTOMY LEVEL 2 N/A 11/11/2013   Procedure: DECOMPRESSIVE LUMBAR LAMINECTOMY L3-L5;  Surgeon: Melina Schools, MD;  Location: [place];  Service: Orthopedics;  Laterality: N/A;   DILATION AND CURETTAGE OF UTERUS  1990   SAB   LUMBAR LAMINECTOMY  11/11/2013   L3   L5     DR Rolena Infante   WISDOM TOOTH EXTRACTION  1968    No current facility-administered medications for this encounter.   Current Outpatient Medications  Medication Sig Dispense Refill Last Dose   BIOTIN PO Take 1 tablet by mouth daily.       celecoxib (CELEBREX) 200 MG capsule Take 200 mg by mouth 2 (two) times daily.       Cholecalciferol (VITAMIN D) 50 MCG (2000 UT) CAPS Take 2,000 Units by mouth daily.      denosumab (PROLIA) 60 MG/ML SOLN injection Inject 60 mg into the skin every 6 (six) months. Administer in upper arm, thigh, or abdomen      folic acid (FOLVITE) 1 MG tablet  Take 2 mg by mouth daily.      lisinopril (ZESTRIL) 10 MG tablet Take 10 mg by mouth daily.       Multiple Vitamin (MULTIVITAMIN WITH MINERALS) TABS tablet Take 1 tablet by mouth every other day.       Omega-3 Fatty Acids (FISH OIL PO) Take 1,000 mg by mouth daily.       RINVOQ 15 MG TB24 Take 15 mg by mouth daily.       Cholecalciferol (VITAMIN D PO) Take 5,000 Units by mouth daily.  (Patient not taking: Reported on 06/17/2020)   Not Taking at Unknown time   methotrexate (RHEUMATREX) 2.5 MG tablet Takes 8 tablets a week (Patient not taking: Reported on 06/17/2020)   Not Taking at Unknown time   Allergies  Allergen Reactions   Codeine     fainting   Latex Itching   Neosporin  [Neomycin-Bacitracin Zn-Polymyx] Rash    Social History   Tobacco Use   Smoking status: Never Smoker   Smokeless tobacco: Never Used  Substance Use Topics   Alcohol use: No    Alcohol/week: 0.0 standard drinks    Family History  Problem Relation Age of Onset   Breast cancer Mother 54   Hypertension Mother    Thyroid disease Mother    Cancer Mother        ocular melanoma   Cancer Father        prostate   Hypertension Father    Thyroid disease Father    Heart attack Maternal Grandmother    Cancer Maternal Grandfather        blood   Cancer Paternal Grandmother        stomach     Review of Systems  Constitutional: Negative.   HENT: Negative.   Eyes: Negative.   Respiratory: Negative.   Cardiovascular: Negative.   Gastrointestinal: Negative.   Genitourinary: Negative.   Musculoskeletal: Positive for joint pain.  Skin: Negative.   Neurological: Negative.   Endo/Heme/Allergies: Negative.   Psychiatric/Behavioral: Negative.       Objective:  Physical Exam Constitutional:      Appearance: She is well-developed.  HENT:     Head: Normocephalic.  Eyes:     Pupils: Pupils are equal, round, and reactive to light.  Neck:     Thyroid: No thyromegaly.     Vascular: No JVD.     Trachea: No tracheal deviation.  Cardiovascular:     Rate and Rhythm: Normal rate and regular rhythm.  Pulmonary:     Effort: Pulmonary effort is normal. No respiratory distress.     Breath sounds: Normal breath sounds. No wheezing.  Abdominal:     Palpations: Abdomen is soft.     Tenderness: There is no abdominal tenderness. There is no guarding.  Musculoskeletal:     Cervical back: Neck supple.     Left hip: Tenderness and bony tenderness present. No deformity. Decreased range of motion. Decreased strength.  Lymphadenopathy:     Cervical: No cervical adenopathy.  Skin:    General: Skin is warm and dry.  Neurological:     Mental Status: She is alert and oriented to person,  place, and time.       Labs:  Estimated body mass index is 24.99 kg/m as calculated from the following:   Height as of 06/14/20: 5\' 5"  (1.651 m).   Weight as of 06/14/20: 68.1 kg.   Imaging Review Plain radiographs demonstrate severe degenerative joint disease of the left hip(s). The  bone quality appears to be good for age and reported activity level.      Assessment/Plan:  End stage arthritis, left hip(s)  The patient history, physical examination, clinical judgement of the provider and imaging studies are consistent with end stage degenerative joint disease of the left hip(s) and total hip arthroplasty is deemed medically necessary. The treatment options including medical management, injection therapy, arthroscopy and arthroplasty were discussed at length. The risks and benefits of total hip arthroplasty were presented and reviewed. The risks due to aseptic loosening, infection, stiffness, dislocation/subluxation,  thromboembolic complications and other imponderables were discussed.  The patient acknowledged the explanation, agreed to proceed with the plan and consent was signed. Patient is being admitted for treatment for surgery, pain control, PT, OT, prophylactic antibiotics, VTE prophylaxis, progressive ambulation and ADL's and discharge planning.The patient is planning to be discharged home.

## 2020-06-22 ENCOUNTER — Encounter (HOSPITAL_COMMUNITY): Payer: Self-pay

## 2020-06-22 ENCOUNTER — Other Ambulatory Visit: Payer: Self-pay

## 2020-06-22 ENCOUNTER — Encounter (HOSPITAL_COMMUNITY)
Admission: RE | Admit: 2020-06-22 | Discharge: 2020-06-22 | Disposition: A | Discharge status: Home / Self Care | Payer: Medicare PPO | Source: Ambulatory Visit | Attending: Orthopedic Surgery | Admitting: Orthopedic Surgery

## 2020-06-22 DIAGNOSIS — Z01812 Encounter for preprocedural laboratory examination: Secondary | ICD-10-CM | POA: Insufficient documentation

## 2020-06-22 HISTORY — DX: Basal cell carcinoma of skin, unspecified: C44.91

## 2020-06-22 HISTORY — DX: Gastro-esophageal reflux disease without esophagitis: K21.9

## 2020-06-22 HISTORY — DX: Pneumonia, unspecified organism: J18.9

## 2020-06-22 LAB — CBC
HCT: 36.6 % (ref 36.0–46.0)
Hemoglobin: 12.4 g/dL (ref 12.0–15.0)
MCH: 32.6 pg (ref 26.0–34.0)
MCHC: 33.9 g/dL (ref 30.0–36.0)
MCV: 96.3 fL (ref 80.0–100.0)
Platelets: 178 10*3/uL (ref 150–400)
RBC: 3.8 MIL/uL — ABNORMAL LOW (ref 3.87–5.11)
RDW: 13.1 % (ref 11.5–15.5)
WBC: 6 10*3/uL (ref 4.0–10.5)
nRBC: 0 % (ref 0.0–0.2)

## 2020-06-22 LAB — SURGICAL PCR SCREEN
MRSA, PCR: NEGATIVE
Staphylococcus aureus: NEGATIVE

## 2020-06-24 ENCOUNTER — Other Ambulatory Visit (HOSPITAL_COMMUNITY)
Admission: RE | Admit: 2020-06-24 | Discharge: 2020-06-24 | Disposition: A | Discharge status: Home / Self Care | Payer: Medicare PPO | Source: Ambulatory Visit | Attending: Orthopedic Surgery | Admitting: Orthopedic Surgery

## 2020-06-24 DIAGNOSIS — Z20822 Contact with and (suspected) exposure to covid-19: Secondary | ICD-10-CM | POA: Diagnosis not present

## 2020-06-24 DIAGNOSIS — Z01812 Encounter for preprocedural laboratory examination: Secondary | ICD-10-CM | POA: Insufficient documentation

## 2020-06-24 LAB — SARS CORONAVIRUS 2 (TAT 6-24 HRS): SARS Coronavirus 2: NEGATIVE

## 2020-06-28 ENCOUNTER — Ambulatory Visit (HOSPITAL_COMMUNITY): Payer: Medicare PPO

## 2020-06-28 ENCOUNTER — Other Ambulatory Visit: Payer: Self-pay

## 2020-06-28 ENCOUNTER — Ambulatory Visit (HOSPITAL_COMMUNITY): Payer: Medicare PPO | Admitting: Registered Nurse

## 2020-06-28 ENCOUNTER — Encounter (HOSPITAL_COMMUNITY)
Admission: RE | Disposition: A | Discharge status: Home / Self Care | Payer: Self-pay | Source: Ambulatory Visit | Attending: Orthopedic Surgery

## 2020-06-28 ENCOUNTER — Encounter (HOSPITAL_COMMUNITY): Payer: Self-pay | Admitting: Orthopedic Surgery

## 2020-06-28 ENCOUNTER — Observation Stay (HOSPITAL_COMMUNITY)
Admission: RE | Admit: 2020-06-28 | Discharge: 2020-06-29 | Disposition: A | Discharge status: Home / Self Care | Payer: Medicare PPO | Source: Ambulatory Visit | Attending: Orthopedic Surgery | Admitting: Orthopedic Surgery

## 2020-06-28 DIAGNOSIS — M069 Rheumatoid arthritis, unspecified: Secondary | ICD-10-CM | POA: Insufficient documentation

## 2020-06-28 DIAGNOSIS — M1612 Unilateral primary osteoarthritis, left hip: Secondary | ICD-10-CM | POA: Diagnosis not present

## 2020-06-28 DIAGNOSIS — Z91040 Latex allergy status: Secondary | ICD-10-CM | POA: Insufficient documentation

## 2020-06-28 DIAGNOSIS — Z419 Encounter for procedure for purposes other than remedying health state, unspecified: Secondary | ICD-10-CM

## 2020-06-28 DIAGNOSIS — Z96642 Presence of left artificial hip joint: Secondary | ICD-10-CM | POA: Diagnosis not present

## 2020-06-28 DIAGNOSIS — Z96649 Presence of unspecified artificial hip joint: Secondary | ICD-10-CM

## 2020-06-28 DIAGNOSIS — K219 Gastro-esophageal reflux disease without esophagitis: Secondary | ICD-10-CM | POA: Diagnosis not present

## 2020-06-28 DIAGNOSIS — M1611 Unilateral primary osteoarthritis, right hip: Secondary | ICD-10-CM | POA: Diagnosis not present

## 2020-06-28 DIAGNOSIS — Z471 Aftercare following joint replacement surgery: Secondary | ICD-10-CM | POA: Diagnosis not present

## 2020-06-28 DIAGNOSIS — Z79899 Other long term (current) drug therapy: Secondary | ICD-10-CM | POA: Diagnosis not present

## 2020-06-28 DIAGNOSIS — I10 Essential (primary) hypertension: Secondary | ICD-10-CM | POA: Diagnosis not present

## 2020-06-28 HISTORY — PX: TOTAL HIP ARTHROPLASTY: SHX124

## 2020-06-28 LAB — ABO/RH: ABO/RH(D): A POS

## 2020-06-28 LAB — TYPE AND SCREEN
ABO/RH(D): A POS
Antibody Screen: NEGATIVE

## 2020-06-28 SURGERY — ARTHROPLASTY, HIP, TOTAL, ANTERIOR APPROACH
Anesthesia: Spinal | Site: Hip | Laterality: Left

## 2020-06-28 MED ORDER — OXYCODONE HCL 5 MG PO TABS
ORAL_TABLET | ORAL | Status: AC
  Filled 2020-06-28: qty 1

## 2020-06-28 MED ORDER — METHOCARBAMOL 500 MG PO TABS: 500.0000 mg | ORAL_TABLET | Freq: Four times a day (QID) | ORAL | 0 refills | Status: DC | PRN

## 2020-06-28 MED ORDER — ACETAMINOPHEN 500 MG PO TABS: 1000.0000 mg | ORAL_TABLET | Freq: Three times a day (TID) | ORAL | 0 refills | Status: DC

## 2020-06-28 MED ORDER — CEFAZOLIN SODIUM-DEXTROSE 2-4 GM/100ML-% IV SOLN
INTRAVENOUS | Status: AC
  Filled 2020-06-28: qty 100

## 2020-06-28 MED ORDER — ONDANSETRON HCL 4 MG/2ML IJ SOLN
INTRAMUSCULAR | Status: DC | PRN
  Administered 2020-06-28: 4 mg via INTRAVENOUS

## 2020-06-28 MED ORDER — LACTATED RINGERS IV SOLN: INTRAVENOUS | Status: DC

## 2020-06-28 MED ORDER — ONDANSETRON HCL 4 MG/2ML IJ SOLN
INTRAMUSCULAR | Status: AC
  Filled 2020-06-28: qty 2

## 2020-06-28 MED ORDER — SODIUM CHLORIDE 0.9 % IV SOLN: INTRAVENOUS | Status: DC

## 2020-06-28 MED ORDER — EPHEDRINE 5 MG/ML INJ
INTRAVENOUS | Status: AC
  Filled 2020-06-28: qty 10

## 2020-06-28 MED ORDER — ORAL CARE MOUTH RINSE: 15.0000 mL | Freq: Once | OROMUCOSAL | Status: AC

## 2020-06-28 MED ORDER — TRANEXAMIC ACID-NACL 1000-0.7 MG/100ML-% IV SOLN
1000.0000 mg | INTRAVENOUS | Status: AC
  Administered 2020-06-28: 1000 mg via INTRAVENOUS
  Filled 2020-06-28: qty 100

## 2020-06-28 MED ORDER — ACETAMINOPHEN 500 MG PO TABS
ORAL_TABLET | ORAL | Status: AC
  Filled 2020-06-28: qty 2

## 2020-06-28 MED ORDER — METHOCARBAMOL 500 MG IVPB - SIMPLE MED
500.0000 mg | Freq: Four times a day (QID) | INTRAVENOUS | Status: DC | PRN
  Administered 2020-06-28: 500 mg via INTRAVENOUS
  Filled 2020-06-28: qty 50

## 2020-06-28 MED ORDER — METOCLOPRAMIDE HCL 5 MG/ML IJ SOLN: 5.0000 mg | Freq: Three times a day (TID) | INTRAMUSCULAR | Status: DC | PRN

## 2020-06-28 MED ORDER — DEXAMETHASONE SODIUM PHOSPHATE 10 MG/ML IJ SOLN
INTRAMUSCULAR | Status: AC
  Filled 2020-06-28: qty 1

## 2020-06-28 MED ORDER — PROPOFOL 10 MG/ML IV BOLUS
INTRAVENOUS | Status: DC | PRN
  Administered 2020-06-28: 100 mg via INTRAVENOUS

## 2020-06-28 MED ORDER — LACTATED RINGERS IV BOLUS
250.0000 mL | Freq: Once | INTRAVENOUS | Status: AC
  Administered 2020-06-28: 250 mL via INTRAVENOUS

## 2020-06-28 MED ORDER — CHLORHEXIDINE GLUCONATE 0.12 % MT SOLN
15.0000 mL | Freq: Once | OROMUCOSAL | Status: AC
  Administered 2020-06-28: 15 mL via OROMUCOSAL

## 2020-06-28 MED ORDER — TRANEXAMIC ACID-NACL 1000-0.7 MG/100ML-% IV SOLN
1000.0000 mg | Freq: Once | INTRAVENOUS | Status: AC
  Administered 2020-06-28: 1000 mg via INTRAVENOUS

## 2020-06-28 MED ORDER — ASPIRIN 81 MG PO CHEW: 81.0000 mg | CHEWABLE_TABLET | Freq: Two times a day (BID) | ORAL | 0 refills | Status: AC

## 2020-06-28 MED ORDER — SODIUM CHLORIDE 0.9 % IR SOLN
Status: DC | PRN
  Administered 2020-06-28: 1000 mL

## 2020-06-28 MED ORDER — ONDANSETRON HCL 4 MG/2ML IJ SOLN
4.0000 mg | Freq: Once | INTRAMUSCULAR | Status: AC | PRN
  Administered 2020-06-28: 4 mg via INTRAVENOUS

## 2020-06-28 MED ORDER — TRAMADOL HCL 50 MG PO TABS
ORAL_TABLET | ORAL | Status: AC
  Filled 2020-06-28: qty 1

## 2020-06-28 MED ORDER — ACETAMINOPHEN 500 MG PO TABS
1000.0000 mg | ORAL_TABLET | Freq: Three times a day (TID) | ORAL | Status: AC
  Administered 2020-06-28 – 2020-06-29 (×4): 1000 mg via ORAL
  Filled 2020-06-28 (×3): qty 2

## 2020-06-28 MED ORDER — TRAMADOL HCL 50 MG PO TABS: 50.0000 mg | ORAL_TABLET | Freq: Four times a day (QID) | ORAL | 0 refills | Status: DC | PRN

## 2020-06-28 MED ORDER — FENTANYL CITRATE (PF) 100 MCG/2ML IJ SOLN
25.0000 ug | INTRAMUSCULAR | Status: DC | PRN
  Administered 2020-06-28: 50 ug via INTRAVENOUS

## 2020-06-28 MED ORDER — BUPIVACAINE IN DEXTROSE 0.75-8.25 % IT SOLN
INTRATHECAL | Status: DC | PRN
  Administered 2020-06-28: 1.8 mL via INTRATHECAL

## 2020-06-28 MED ORDER — LACTATED RINGERS IV BOLUS
500.0000 mL | Freq: Once | INTRAVENOUS | Status: AC
  Administered 2020-06-28: 500 mL via INTRAVENOUS

## 2020-06-28 MED ORDER — FENTANYL CITRATE (PF) 100 MCG/2ML IJ SOLN
INTRAMUSCULAR | Status: DC | PRN
Start: 2020-06-28 — End: 2020-06-28
  Administered 2020-06-28 (×4): 25 ug via INTRAVENOUS

## 2020-06-28 MED ORDER — ONDANSETRON HCL 4 MG/2ML IJ SOLN
4.0000 mg | Freq: Four times a day (QID) | INTRAMUSCULAR | Status: DC | PRN
  Administered 2020-06-29: 4 mg via INTRAVENOUS
  Filled 2020-06-28: qty 2

## 2020-06-28 MED ORDER — PHENYLEPHRINE 40 MCG/ML (10ML) SYRINGE FOR IV PUSH (FOR BLOOD PRESSURE SUPPORT)
PREFILLED_SYRINGE | INTRAVENOUS | Status: DC | PRN
  Administered 2020-06-28: 80 ug via INTRAVENOUS

## 2020-06-28 MED ORDER — DOCUSATE SODIUM 100 MG PO CAPS: 100.0000 mg | ORAL_CAPSULE | Freq: Two times a day (BID) | ORAL | 0 refills | Status: DC

## 2020-06-28 MED ORDER — ONDANSETRON HCL 4 MG PO TABS
4.0000 mg | ORAL_TABLET | Freq: Four times a day (QID) | ORAL | Status: DC | PRN
  Filled 2020-06-28: qty 1

## 2020-06-28 MED ORDER — FENTANYL CITRATE (PF) 100 MCG/2ML IJ SOLN
INTRAMUSCULAR | Status: AC
  Administered 2020-06-28: 50 ug via INTRAVENOUS
  Filled 2020-06-28: qty 2

## 2020-06-28 MED ORDER — METHOCARBAMOL 500 MG PO TABS
500.0000 mg | ORAL_TABLET | Freq: Four times a day (QID) | ORAL | Status: DC | PRN
  Administered 2020-06-28 – 2020-06-29 (×3): 500 mg via ORAL
  Filled 2020-06-28 (×3): qty 1

## 2020-06-28 MED ORDER — TRAMADOL HCL 50 MG PO TABS
50.0000 mg | ORAL_TABLET | Freq: Four times a day (QID) | ORAL | Status: DC | PRN
  Administered 2020-06-28 (×2): 50 mg via ORAL
  Administered 2020-06-29: 100 mg via ORAL
  Filled 2020-06-28: qty 2
  Filled 2020-06-28: qty 1

## 2020-06-28 MED ORDER — FENTANYL CITRATE (PF) 100 MCG/2ML IJ SOLN
INTRAMUSCULAR | Status: AC
  Filled 2020-06-28: qty 2

## 2020-06-28 MED ORDER — PHENYLEPHRINE 40 MCG/ML (10ML) SYRINGE FOR IV PUSH (FOR BLOOD PRESSURE SUPPORT)
PREFILLED_SYRINGE | INTRAVENOUS | Status: AC
  Filled 2020-06-28: qty 10

## 2020-06-28 MED ORDER — TRANEXAMIC ACID-NACL 1000-0.7 MG/100ML-% IV SOLN
INTRAVENOUS | Status: AC
  Filled 2020-06-28: qty 100

## 2020-06-28 MED ORDER — POLYETHYLENE GLYCOL 3350 17 G PO PACK: 17.0000 g | PACK | Freq: Two times a day (BID) | ORAL | 0 refills | Status: DC

## 2020-06-28 MED ORDER — FERROUS SULFATE 325 (65 FE) MG PO TABS: 325.0000 mg | ORAL_TABLET | Freq: Three times a day (TID) | ORAL | 0 refills | Status: DC

## 2020-06-28 MED ORDER — MIDAZOLAM HCL 2 MG/2ML IJ SOLN
INTRAMUSCULAR | Status: AC
  Filled 2020-06-28: qty 2

## 2020-06-28 MED ORDER — DEXAMETHASONE SODIUM PHOSPHATE 10 MG/ML IJ SOLN
10.0000 mg | Freq: Once | INTRAMUSCULAR | Status: AC
  Administered 2020-06-28: 8 mg via INTRAVENOUS

## 2020-06-28 MED ORDER — CEFAZOLIN SODIUM-DEXTROSE 2-4 GM/100ML-% IV SOLN
2.0000 g | Freq: Four times a day (QID) | INTRAVENOUS | Status: AC
  Administered 2020-06-28 (×2): 2 g via INTRAVENOUS
  Filled 2020-06-28: qty 100

## 2020-06-28 MED ORDER — CEFAZOLIN SODIUM-DEXTROSE 2-4 GM/100ML-% IV SOLN
2.0000 g | INTRAVENOUS | Status: AC
  Administered 2020-06-28: 2 g via INTRAVENOUS
  Filled 2020-06-28: qty 100

## 2020-06-28 MED ORDER — METOCLOPRAMIDE HCL 5 MG PO TABS
5.0000 mg | ORAL_TABLET | Freq: Three times a day (TID) | ORAL | Status: DC | PRN
  Filled 2020-06-28: qty 2

## 2020-06-28 MED ORDER — METHOCARBAMOL 500 MG IVPB - SIMPLE MED
INTRAVENOUS | Status: AC
  Filled 2020-06-28: qty 50

## 2020-06-28 MED ORDER — STERILE WATER FOR IRRIGATION IR SOLN
Status: DC | PRN
  Administered 2020-06-28: 2000 mL

## 2020-06-28 MED ORDER — PROPOFOL 500 MG/50ML IV EMUL
INTRAVENOUS | Status: DC | PRN
  Administered 2020-06-28: 40 ug/kg/min via INTRAVENOUS

## 2020-06-28 MED ORDER — PROPOFOL 1000 MG/100ML IV EMUL
INTRAVENOUS | Status: AC
  Filled 2020-06-28: qty 100

## 2020-06-28 MED ORDER — PHENYLEPHRINE HCL (PRESSORS) 10 MG/ML IV SOLN
INTRAVENOUS | Status: AC
  Filled 2020-06-28: qty 2

## 2020-06-28 MED ORDER — EPHEDRINE SULFATE-NACL 50-0.9 MG/10ML-% IV SOSY
PREFILLED_SYRINGE | INTRAVENOUS | Status: DC | PRN
  Administered 2020-06-28 (×2): 10 mg via INTRAVENOUS
  Administered 2020-06-28: 5 mg via INTRAVENOUS

## 2020-06-28 MED ORDER — ACETAMINOPHEN 500 MG PO TABS
1000.0000 mg | ORAL_TABLET | Freq: Once | ORAL | Status: AC
  Administered 2020-06-28: 1000 mg via ORAL
  Filled 2020-06-28: qty 2

## 2020-06-28 MED ORDER — MIDAZOLAM HCL 5 MG/5ML IJ SOLN
INTRAMUSCULAR | Status: DC | PRN
  Administered 2020-06-28: 2 mg via INTRAVENOUS

## 2020-06-28 SURGICAL SUPPLY — 47 items
ADH SKN CLS APL DERMABOND .7 (GAUZE/BANDAGES/DRESSINGS) ×1
BAG DECANTER FOR FLEXI CONT (MISCELLANEOUS) IMPLANT
BAG SPEC THK2 15X12 ZIP CLS (MISCELLANEOUS)
BAG ZIPLOCK 12X15 (MISCELLANEOUS) IMPLANT
BLADE SAG 18X100X1.27 (BLADE) ×2 IMPLANT
BLADE SURG SZ10 CARB STEEL (BLADE) ×4 IMPLANT
COVER PERINEAL POST (MISCELLANEOUS) ×2 IMPLANT
COVER SURGICAL LIGHT HANDLE (MISCELLANEOUS) ×2 IMPLANT
COVER WAND RF STERILE (DRAPES) IMPLANT
CUP ACETBLR 52 OD PINNACLE (Hips) ×1 IMPLANT
DERMABOND ADVANCED (GAUZE/BANDAGES/DRESSINGS) ×1
DERMABOND ADVANCED .7 DNX12 (GAUZE/BANDAGES/DRESSINGS) ×1 IMPLANT
DRAPE STERI IOBAN 125X83 (DRAPES) ×2 IMPLANT
DRAPE U-SHAPE 47X51 STRL (DRAPES) ×4 IMPLANT
DRESSING AQUACEL AG SP 3.5X10 (GAUZE/BANDAGES/DRESSINGS) ×1 IMPLANT
DRSG AQUACEL AG SP 3.5X10 (GAUZE/BANDAGES/DRESSINGS) ×2
DURAPREP 26ML APPLICATOR (WOUND CARE) ×2 IMPLANT
ELECT REM PT RETURN 15FT ADLT (MISCELLANEOUS) ×2 IMPLANT
ELIMINATOR HOLE APEX DEPUY (Hips) ×1 IMPLANT
GLOVE BIO SURGEON STRL SZ 6 (GLOVE) ×2 IMPLANT
GLOVE BIOGEL PI IND STRL 6.5 (GLOVE) ×1 IMPLANT
GLOVE BIOGEL PI IND STRL 7.5 (GLOVE) ×1 IMPLANT
GLOVE BIOGEL PI IND STRL 8.5 (GLOVE) ×1 IMPLANT
GLOVE BIOGEL PI INDICATOR 6.5 (GLOVE)
GLOVE BIOGEL PI INDICATOR 7.5 (GLOVE)
GLOVE BIOGEL PI INDICATOR 8.5 (GLOVE) ×1
GLOVE ECLIPSE 8.0 STRL XLNG CF (GLOVE) ×2 IMPLANT
GLOVE ORTHO TXT STRL SZ7.5 (GLOVE) ×2 IMPLANT
GOWN STRL REUS W/TWL LRG LVL3 (GOWN DISPOSABLE) ×4 IMPLANT
GOWN STRL REUS W/TWL XL LVL3 (GOWN DISPOSABLE) ×2 IMPLANT
HEAD CERAMIC DELTA 36 PLUS 1.5 (Hips) ×1 IMPLANT
HOLDER FOLEY CATH W/STRAP (MISCELLANEOUS) ×2 IMPLANT
KIT TURNOVER KIT A (KITS) ×1 IMPLANT
LINER NEUTRAL 52X36MM PLUS 4 (Liner) ×1 IMPLANT
PACK ANTERIOR HIP CUSTOM (KITS) ×2 IMPLANT
PENCIL SMOKE EVACUATOR (MISCELLANEOUS) ×1 IMPLANT
SCREW 6.5MMX30MM (Screw) ×1 IMPLANT
STEM FEMORAL SZ5 HIGH ACTIS (Stem) ×1 IMPLANT
SUT MNCRL AB 4-0 PS2 18 (SUTURE) ×2 IMPLANT
SUT STRATAFIX 0 PDS 27 VIOLET (SUTURE) ×2
SUT VIC AB 1 CT1 36 (SUTURE) ×6 IMPLANT
SUT VIC AB 2-0 CT1 27 (SUTURE) ×4
SUT VIC AB 2-0 CT1 TAPERPNT 27 (SUTURE) ×2 IMPLANT
SUTURE STRATFX 0 PDS 27 VIOLET (SUTURE) ×1 IMPLANT
TRAY FOL W/BAG SLVR 16FR STRL (SET/KITS/TRAYS/PACK) IMPLANT
TRAY FOLEY W/BAG SLVR 16FR LF (SET/KITS/TRAYS/PACK) ×2
WATER STERILE IRR 1000ML POUR (IV SOLUTION) ×3 IMPLANT

## 2020-06-28 NOTE — Anesthesia Procedure Notes (Addendum)
Spinal  Patient location during procedure: OR Start time: 06/28/2020 9:50 AM End time: 06/28/2020 10:00 AM Staffing Performed: anesthesiologist  Anesthesiologist: Murvin Natal, MD Preanesthetic Checklist Completed: patient identified, IV checked, risks and benefits discussed, surgical consent, monitors and equipment checked, pre-op evaluation and timeout performed Spinal Block Patient position: sitting Prep: DuraPrep Patient monitoring: cardiac monitor, continuous pulse ox and blood pressure Approach: midline Location: L4-5 Injection technique: single-shot Needle Needle type: Introducer  Needle gauge: 20 G. Needle length: 3.5. Needle insertion depth: 3 cm Assessment Sensory level: T10 Events: CSF obtained through introducer Additional Notes Functioning IV was confirmed and monitors were applied. Sterile prep and drape, including hand hygiene and sterile gloves were used. The patient was positioned and the spine was prepped. The skin was anesthetized with lidocaine.  Multiple previous unsuccessful attempts by CRNA. Free flow of clear CSF was obtained through 20 gauge introducer prior to injecting local anesthetic into the CSF. The needle aspirated freely following injection.  The needle was carefully withdrawn.  The patient tolerated the procedure well.

## 2020-06-28 NOTE — Discharge Instructions (Signed)

## 2020-06-28 NOTE — Interval H&P Note (Signed)
History and Physical Interval Note:  06/28/2020 7:03 AM  Jacqueline Tucker  has presented today for surgery, with the diagnosis of Left hip osteoarthritis.  The various methods of treatment have been discussed with the patient and family. After consideration of risks, benefits and other options for treatment, the patient has consented to  Procedure(s) with comments: TOTAL HIP ARTHROPLASTY ANTERIOR APPROACH (Left) - 70 mins as a surgical intervention.  The patient's history has been reviewed, patient examined, no change in status, stable for surgery.  I have reviewed the patient's chart and labs.  Questions were answered to the patient's satisfaction.     Mauri Pole

## 2020-06-28 NOTE — Anesthesia Preprocedure Evaluation (Addendum)
Anesthesia Evaluation  Patient identified by MRN, date of birth, ID band Patient awake    Reviewed: Allergy & Precautions, NPO status , Patient's Chart, lab work & pertinent test results  History of Anesthesia Complications (+) PONV and history of anesthetic complications  Airway Mallampati: III  TM Distance: >3 FB Neck ROM: Full    Dental no notable dental hx.    Pulmonary neg pulmonary ROS,    Pulmonary exam normal breath sounds clear to auscultation       Cardiovascular hypertension, Pt. on medications Normal cardiovascular exam Rhythm:Regular Rate:Normal  ECG: NSR, rate 74   Neuro/Psych  Headaches, negative psych ROS   GI/Hepatic negative GI ROS, Neg liver ROS,   Endo/Other  negative endocrine ROS  Renal/GU negative Renal ROS     Musculoskeletal  (+) Arthritis , Rheumatoid disorders,  Lumbar spine surgery x 1   Abdominal   Peds  Hematology negative hematology ROS (+)   Anesthesia Other Findings Left hip osteoarthritis  Reproductive/Obstetrics                            Anesthesia Physical Anesthesia Plan  ASA: II  Anesthesia Plan: Spinal   Post-op Pain Management:    Induction: Intravenous  PONV Risk Score and Plan: 3 and Ondansetron, Dexamethasone, Propofol infusion and Treatment may vary due to age or medical condition  Airway Management Planned: Simple Face Mask  Additional Equipment:   Intra-op Plan:   Post-operative Plan:   Informed Consent: I have reviewed the patients History and Physical, chart, labs and discussed the procedure including the risks, benefits and alternatives for the proposed anesthesia with the patient or authorized representative who has indicated his/her understanding and acceptance.     Dental advisory given  Plan Discussed with: CRNA  Anesthesia Plan Comments:         Anesthesia Quick Evaluation

## 2020-06-28 NOTE — Op Note (Signed)
NAME:  Jacqueline Tucker                ACCOUNT NO.: [id number]      MEDICAL RECORD NO.: [id number]      FACILITY:  [facility]      PHYSICIAN:  Mauri Pole  DATE OF BIRTH:  Mar 01, 1951     DATE OF PROCEDURE:  06/28/2020                                 OPERATIVE REPORT         PREOPERATIVE DIAGNOSIS: Right  hip osteoarthritis.      POSTOPERATIVE DIAGNOSIS:  Right hip osteoarthritis.      PROCEDURE:  Right total hip replacement through an anterior approach   utilizing DePuy THR system, component size 52 mm pinnacle cup, a size 36+4 neutral   Altrex liner, a size 5 Hi Actis stem with a 36+1.5 delta ceramic   ball.      SURGEON:  Pietro Cassis. Alvan Dame, M.D.      ASSISTANT:  Danae Orleans, PA-C     ANESTHESIA:  General and Spinal.      SPECIMENS:  None.      COMPLICATIONS:  None.      BLOOD LOSS:  575 cc     DRAINS:  None.      INDICATION OF THE PROCEDURE:  Jacqueline Tucker is a 69 y.o. female who had   presented to office for evaluation of right hip pain.  Radiographs revealed   progressive degenerative changes with bone-on-bone   articulation of the  hip joint, including subchondral cystic changes and osteophytes.  The patient had painful limited range of   motion significantly affecting their overall quality of life and function.  The patient was failing to    respond to conservative measures including medications and/or injections and activity modification and at this point was ready   to proceed with more definitive measures.  Consent was obtained for   benefit of pain relief.  Specific risks of infection, DVT, component   failure, dislocation, neurovascular injury, and need for revision surgery were reviewed in the office as well discussion of   the anterior versus posterior approach were reviewed.     PROCEDURE IN DETAIL:  The patient was brought to operative theater.   Once adequate anesthesia, preoperative antibiotics, 2 gm of Ancef, 1 gm of Tranexamic  Acid, and 10 mg of Decadron were administered, the patient was positioned supine on the [employer] table.  Once the patient was safely positioned with adequate padding of boney prominences we predraped out the hip, and used fluoroscopy to confirm orientation of the pelvis.      The right hip was then prepped and draped from proximal iliac crest to   mid thigh with a shower curtain technique.      Time-out was performed identifying the patient, planned procedure, and the appropriate extremity.     An incision was then made 2 cm lateral to the   anterior superior iliac spine extending over the orientation of the   tensor fascia lata muscle and sharp dissection was carried down to the   fascia of the muscle.      The fascia was then incised.  The muscle belly was identified and swept   laterally and retractor placed along the superior neck.  Following   cauterization of the circumflex vessels and  removing some pericapsular   fat, a second cobra retractor was placed on the inferior neck.  A T-capsulotomy was made along the line of the   superior neck to the trochanteric fossa, then extended proximally and   distally.  Tag sutures were placed and the retractors were then placed   intracapsular.  We then identified the trochanteric fossa and   orientation of my neck cut and then made a neck osteotomy with the femur on traction.  The femoral   head was removed without difficulty or complication.  Traction was let   off and retractors were placed posterior and anterior around the   acetabulum.      The labrum and foveal tissue were debrided.  I began reaming with a 44 mm   reamer and reamed up to 51 mm reamer with good bony bed preparation and a 52 mm  cup was chosen.  The final 52 mm Pinnacle cup was then impacted under fluoroscopy to confirm the depth of penetration and orientation with respect to   Abduction and forward flexion.  A screw was placed into the ilium followed by the hole eliminator.   The final   36+4 neutral Altrex liner was impacted with good visualized rim fit.  The cup was positioned anatomically within the acetabular portion of the pelvis.      At this point, the femur was rolled to 100 degrees.  Further capsule was   released off the inferior aspect of the femoral neck.  I then   released the superior capsule proximally.  With the leg in a neutral position the hook was placed laterally   along the femur under the vastus lateralis origin and elevated manually and then held in position using the hook attachment on the bed.  The leg was then extended and adducted with the leg rolled to 100   degrees of external rotation.  Retractors were placed along the medial calcar and posteriorly over the greater trochanter.  Once the proximal femur was fully   exposed, I used a box osteotome to set orientation.  I then began   broaching with the starting chili pepper broach and passed this by hand and then broached up to 5.  With the 5 broach in place I chose a high offset neck and did several trial reductions.  The offset was appropriate, leg lengths   appeared to be equal best matched with the +1.5 head ball trial confirmed radiographically.   Given these findings, I went ahead and dislocated the hip, repositioned all   retractors and positioned the right hip in the extended and abducted position.  The final 5 Hi Actis stem was   chosen and it was impacted down to the level of neck cut.  Based on this   and the trial reductions, a final 36+1.5 delta ceramic ball was chosen and   impacted onto a clean and dry trunnion, and the hip was reduced.  The   hip had been irrigated throughout the case again at this point.  I did   reapproximate the superior capsular leaflet to the anterior leaflet   using #1 Vicryl.  The fascia of the   tensor fascia lata muscle was then reapproximated using #1 Vicryl and #0 Stratafix sutures.  The   remaining wound was closed with 2-0 Vicryl and running 4-0  Monocryl.   The hip was cleaned, dried, and dressed sterilely using Dermabond and   Aquacel dressing.  The patient was then brought  to recovery room in stable condition tolerating the procedure well.    Danae Orleans, PA-C was present for the entirety of the case involved from   preoperative positioning, perioperative retractor management, general   facilitation of the case, as well as primary wound closure as assistant.            Pietro Cassis Alvan Dame, M.D.        06/28/2020 11:22 AM

## 2020-06-28 NOTE — Anesthesia Procedure Notes (Signed)
Procedure Name: LMA Insertion Date/Time: 06/28/2020 10:40 AM Performed by: Victoriano Lain, CRNA Pre-anesthesia Checklist: Patient identified, Emergency Drugs available, Suction available and Patient being monitored Patient Re-evaluated:Patient Re-evaluated prior to induction Oxygen Delivery Method: Circle system utilized Preoxygenation: Pre-oxygenation with 100% oxygen Induction Type: IV induction LMA: LMA inserted LMA Size: 4.0 Number of attempts: 1 Placement Confirmation: positive ETCO2 and breath sounds checked- equal and bilateral Tube secured with: Tape Dental Injury: Teeth and Oropharynx as per pre-operative assessment

## 2020-06-28 NOTE — Transfer of Care (Signed)
Immediate Anesthesia Transfer of Care Note  Patient: Jacqueline Tucker  Procedure(s) Performed: TOTAL HIP ARTHROPLASTY ANTERIOR APPROACH (Left Hip)  Patient Location: PACU  Anesthesia Type:General Level of Consciousness: awake, alert , oriented and patient cooperative  Airway & Oxygen Therapy: Patient Spontanous Breathing and Patient connected to face mask oxygen  Post-op Assessment: Report given to RN and Post -op Vital signs reviewed and stable  Post vital signs: Reviewed and stable  Last Vitals:  Vitals Value Taken Time  BP 115/68 06/28/20 1149  Temp    Pulse 77 06/28/20 1151  Resp 17 06/28/20 1151  SpO2 100 % 06/28/20 1151  Vitals shown include unvalidated device data.  Last Pain:  Vitals:   06/28/20 0719  TempSrc: Oral  PainSc:       Patients Stated Pain Goal: 5 (84/72/07 2182)  Complications: No complications documented.

## 2020-06-28 NOTE — Progress Notes (Signed)
Physical Therapy Treatment Patient Details Name: Jacqueline Tucker MRN: [id number] DOB: 10-Feb-1951 Today's Date: 06/28/2020    History of Present Illness s/p L DA THA. PMH: decompressive lum lam L3-5, RA    PT Comments    Pt amb in hallway and up 3 steps, became diaphoretic, assisted down 2 steps and  to recliner with +2 assist.   BP 83/47.  Pt is mobilizing well however continues to experience  Low BP/diaphoresis, is at risk for falls d/t medical issues. Will defer d/c to nursing and MD. If pt remains overnight will see in am.   Follow Up Recommendations  Follow surgeon's recommendation for DC plan and follow-up therapies     Equipment Recommendations  None recommended by PT    Recommendations for Other Services       Precautions / Restrictions Precautions Precautions: Fall Restrictions Weight Bearing Restrictions: No    Mobility  Bed Mobility Overal bed mobility: Needs Assistance Bed Mobility: Supine to Sit     Supine to sit: Min assist Sit to supine: Mod assist   General bed mobility comments: assist  with LLE off bed, min assist for safety  Transfers Overall transfer level: Needs assistance Equipment used: Rolling walker (2 wheeled) Transfers: Sit to/from Stand Sit to Stand: Min guard;Min assist         General transfer comment: cues for hand placement and overall safety  Ambulation/Gait Ambulation/Gait assistance: Min assist Gait Distance (Feet): 60 Feet Assistive device: Rolling walker (2 wheeled) Gait Pattern/deviations: Step-to pattern;Decreased stance time - left     General Gait Details: cues for sequence and RW position   Stairs Stairs: Yes Stairs assistance: Min assist;Mod assist;+2 safety/equipment Stair Management: Step to pattern Number of Stairs: 3 General stair comments: cues for sequence, became diaphoretic after up 3 steps. assisted down 2 steps with +2 assist for safety   Wheelchair Mobility    Modified Rankin (Stroke Patients  Only)       Balance Overall balance assessment: Needs assistance   Sitting balance-Leahy Scale: Good       Standing balance-Leahy Scale: Poor                              Cognition Arousal/Alertness: Awake/alert Behavior During Therapy: WFL for tasks assessed/performed Overall Cognitive Status: Within Functional Limits for tasks assessed                                        Exercises Total Joint Exercises Ankle Circles/Pumps: AROM;Both;10 reps Quad Sets: AROM;Both;10 reps Gluteal Sets: 10 reps;Both;AROM Heel Slides: AAROM;Left;AROM;10 reps Hip ABduction/ADduction: AAROM;Left;10 reps    General Comments        Pertinent Vitals/Pain Pain Assessment: 0-10 Pain Score: 5  Pain Location: left hip Pain Descriptors / Indicators: Grimacing;Sore Pain Intervention(s): Limited activity within patient's tolerance;Monitored during session;Premedicated before session    [place] expects to be discharged to:: Private residence Living Arrangements: Spouse/significant other Available Help at Discharge: Friend(s) Type of Home: House Home Access: Stairs to enter Entrance Stairs-Rails: Right Home Layout: One level Home Equipment: None      Prior Function Level of Independence: Independent          PT Goals (current goals can now be found in the care plan section) Acute Rehab PT Goals Patient Stated Goal: less hip pain PT Goal Formulation: With patient  Time For Goal Achievement: 07/05/20 Potential to Achieve Goals: Good Progress towards PT goals: Progressing toward goals    Frequency    7X/week      PT Plan Current plan remains appropriate    Co-evaluation              AM-PAC PT "6 Clicks" Mobility   Outcome Measure  Help needed turning from your back to your side while in a flat bed without using bedrails?: A Little Help needed moving from lying on your back to sitting on the side of a flat bed without  using bedrails?: A Little Help needed moving to and from a bed to a chair (including a wheelchair)?: A Little Help needed standing up from a chair using your arms (e.g., wheelchair or bedside chair)?: A Little Help needed to walk in hospital room?: A Lot Help needed climbing 3-5 steps with a railing? : A Lot 6 Click Score: 16    End of Session Equipment Utilized During Treatment: Gait belt Activity Tolerance: Patient tolerated treatment well Patient left: with call bell/phone within reach;in chair Nurse Communication: Mobility status (BP) PT Visit Diagnosis: Difficulty in walking, not elsewhere classified (R26.2);Other abnormalities of gait and mobility (R26.89)     Time: 6629-4765 PT Time Calculation (min) (ACUTE ONLY): 33 min  Charges:  $Gait Training: 8-22 mins                     Baxter Flattery, PT  Acute Rehab Dept ([place]) [phone] Pager [phone]  06/28/2020    [facility] 06/28/2020, 5:12 PM

## 2020-06-28 NOTE — Anesthesia Postprocedure Evaluation (Signed)
Anesthesia Post Note  Patient: LOVE CHOWNING  Procedure(s) Performed: TOTAL HIP ARTHROPLASTY ANTERIOR APPROACH (Left Hip)     Patient location during evaluation: Phase II Anesthesia Type: Spinal and General Level of consciousness: awake and alert Pain management: pain level controlled Vital Signs Assessment: post-procedure vital signs reviewed and stable Respiratory status: spontaneous breathing, nonlabored ventilation, respiratory function stable and patient connected to nasal cannula oxygen Cardiovascular status: blood pressure returned to baseline and stable Postop Assessment: no apparent nausea or vomiting Comments: Patient evaluated multiple times in PACU and Phase II. Difficulty with spinal anesthesia and conversion to general anesthesia discussed with patient and RN. Increased risk of headache and conservative treatments for post dural puncture headache discussed with patient as well as surgical team. All questions answered. Will monitor.    No complications documented.  Last Vitals:  Vitals:   06/28/20 1800 06/28/20 1833  BP: 127/80 110/63  Pulse: 83 72  Resp: 16 18  Temp: 36.6 C 36.6 C  SpO2: (!) 83% 100%    Last Pain:  Vitals:   06/28/20 1905  TempSrc:   PainSc: 3                  Laquasia Pincus P Bishop Vanderwerf

## 2020-06-28 NOTE — Evaluation (Signed)
Physical Therapy Evaluation Patient Details Name: Jacqueline Tucker MRN: [id number] DOB: 1950/12/13 Today's Date: 06/28/2020   History of Present Illness  s/p L DA THA. 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  Pt limited by knee buckling LEs likely d/t residual effects of spinal; dizzy with standing ~ 30 seconds. Will attempt again later today   Pt will benefit from skilled PT to increase their independence and safety with mobility to allow discharge to the venue listed below.      Follow Up Recommendations Follow surgeon's recommendation for DC plan and follow-up therapies    Equipment Recommendations  None recommended by PT    Recommendations for Other Services       Precautions / Restrictions Precautions Precautions: Fall Restrictions Weight Bearing Restrictions: No      Mobility  Bed Mobility Overal bed mobility: Needs Assistance Bed Mobility: Supine to Sit;Sit to Supine     Supine to sit: Min assist Sit to supine: Mod assist   General bed mobility comments: assist  with LLE off bed and bil LEs on return to supine d/t dizziness  Transfers Overall transfer level: Needs assistance Equipment used: Rolling walker (2 wheeled) Transfers: Sit to/from Stand Sit to Stand: Min assist;Mod assist         General transfer comment: min assist to transition to standing, cues for hand placement, knees buckling with lateral wt shifting  Ambulation/Gait             General Gait Details: NT  Stairs            Wheelchair Mobility    Modified Rankin (Stroke Patients Only)       Balance Overall balance assessment: Needs assistance   Sitting balance-Leahy Scale: Good       Standing balance-Leahy Scale: Poor                               Pertinent Vitals/Pain Pain Assessment: 0-10 Pain Score: 5  Pain Location: left hip Pain Descriptors / Indicators:  Grimacing;Sore Pain Intervention(s): Limited activity within patient's tolerance;Monitored during session;Premedicated before session    [place] expects to be discharged to:: Private residence Living Arrangements: Spouse/significant other Available Help at Discharge: Friend(s) Type of Home: House Home Access: Stairs to enter Entrance Stairs-Rails: Right   Home Layout: One level Home Equipment: None      Prior Function Level of Independence: Independent               Hand Dominance        Extremity/Trunk Assessment   Upper Extremity Assessment Upper Extremity Assessment: Overall WFL for tasks assessed    Lower Extremity Assessment Lower Extremity Assessment: LLE deficits/detail RLE Deficits / Details: WFL LLE Deficits / Details: L hip extension 2/5, knee grossly 3/5, ankle WFL       Communication   Communication: No difficulties  Cognition Arousal/Alertness: Awake/alert Behavior During Therapy: WFL for tasks assessed/performed Overall Cognitive Status: Within Functional Limits for tasks assessed                                        General Comments      Exercises Total Joint Exercises Ankle Circles/Pumps: AROM;Both;10 reps Quad Sets: AROM;Both;10 reps Gluteal Sets: 10 reps;Both;AROM  Heel Slides: AAROM;Left;AROM;10 reps Hip ABduction/ADduction: AAROM;Left;10 reps   Assessment/Plan    PT Assessment Patient needs continued PT services  PT Problem List Decreased strength;Decreased mobility;Decreased activity tolerance;Decreased balance;Decreased knowledge of use of DME;Pain       PT Treatment Interventions DME instruction;Therapeutic exercise;Gait training;Functional mobility training;Therapeutic activities;Patient/family education;Stair training    PT Goals (Current goals can be found in the Care Plan section)  Acute Rehab PT Goals Patient Stated Goal: less hip pain PT Goal Formulation: With patient Time For  Goal Achievement: 07/05/20 Potential to Achieve Goals: Good    Frequency 7X/week   Barriers to discharge        Co-evaluation               AM-PAC PT "6 Clicks" Mobility  Outcome Measure Help needed turning from your back to your side while in a flat bed without using bedrails?: A Little Help needed moving from lying on your back to sitting on the side of a flat bed without using bedrails?: A Little Help needed moving to and from a bed to a chair (including a wheelchair)?: Total Help needed standing up from a chair using your arms (e.g., wheelchair or bedside chair)?: Total Help needed to walk in hospital room?: Total Help needed climbing 3-5 steps with a railing? : Total 6 Click Score: 10    End of Session Equipment Utilized During Treatment: Gait belt Activity Tolerance: Patient tolerated treatment well Patient left: with call bell/phone within reach;in bed Nurse Communication: Mobility status;Other (comment) (dizzy) PT Visit Diagnosis: Difficulty in walking, not elsewhere classified (R26.2);Other abnormalities of gait and mobility (R26.89)    Time: 8341-9622 PT Time Calculation (min) (ACUTE ONLY): 18 min   Charges:   PT Evaluation $PT Eval Low Complexity: [place], PT  Acute Rehab Dept ([place]) [phone] Pager [phone]  06/28/2020   [facility] 06/28/2020, 3:14 PM

## 2020-06-29 ENCOUNTER — Encounter (HOSPITAL_COMMUNITY): Payer: Self-pay | Admitting: Orthopedic Surgery

## 2020-06-29 DIAGNOSIS — Z79899 Other long term (current) drug therapy: Secondary | ICD-10-CM | POA: Diagnosis not present

## 2020-06-29 DIAGNOSIS — M069 Rheumatoid arthritis, unspecified: Secondary | ICD-10-CM | POA: Diagnosis not present

## 2020-06-29 DIAGNOSIS — I10 Essential (primary) hypertension: Secondary | ICD-10-CM | POA: Diagnosis not present

## 2020-06-29 DIAGNOSIS — Z91040 Latex allergy status: Secondary | ICD-10-CM | POA: Diagnosis not present

## 2020-06-29 DIAGNOSIS — M1611 Unilateral primary osteoarthritis, right hip: Secondary | ICD-10-CM | POA: Diagnosis not present

## 2020-06-29 LAB — CBC
HCT: 26.6 % — ABNORMAL LOW (ref 36.0–46.0)
Hemoglobin: 9 g/dL — ABNORMAL LOW (ref 12.0–15.0)
MCH: 32.7 pg (ref 26.0–34.0)
MCHC: 33.8 g/dL (ref 30.0–36.0)
MCV: 96.7 fL (ref 80.0–100.0)
Platelets: 132 10*3/uL — ABNORMAL LOW (ref 150–400)
RBC: 2.75 MIL/uL — ABNORMAL LOW (ref 3.87–5.11)
RDW: 12.9 % (ref 11.5–15.5)
WBC: 7.7 10*3/uL (ref 4.0–10.5)
nRBC: 0 % (ref 0.0–0.2)

## 2020-06-29 LAB — BASIC METABOLIC PANEL
Anion gap: 11 (ref 5–15)
BUN: 15 mg/dL (ref 8–23)
CO2: 22 mmol/L (ref 22–32)
Calcium: 7.8 mg/dL — ABNORMAL LOW (ref 8.9–10.3)
Chloride: 103 mmol/L (ref 98–111)
Creatinine, Ser: 0.58 mg/dL (ref 0.44–1.00)
GFR calc Af Amer: >60 mL/min (ref >60)
GFR calc non Af Amer: >60 mL/min (ref >60)
Glucose, Bld: 155 mg/dL — ABNORMAL HIGH (ref 70–99)
Potassium: 3.6 mmol/L (ref 3.5–5.1)
Sodium: 136 mmol/L (ref 135–145)

## 2020-06-29 MED ORDER — DEXAMETHASONE SODIUM PHOSPHATE 10 MG/ML IJ SOLN
10.0000 mg | Freq: Once | INTRAMUSCULAR | Status: AC
  Administered 2020-06-29: 10 mg via INTRAVENOUS
  Filled 2020-06-29: qty 1

## 2020-06-29 MED ORDER — MENTHOL 3 MG MT LOZG: 1.0000 | LOZENGE | OROMUCOSAL | Status: DC | PRN

## 2020-06-29 MED ORDER — PHENOL 1.4 % MT LIQD: 1.0000 | OROMUCOSAL | Status: DC | PRN

## 2020-06-29 MED ORDER — FERROUS SULFATE 325 (65 FE) MG PO TABS
325.0000 mg | ORAL_TABLET | Freq: Three times a day (TID) | ORAL | Status: DC
  Administered 2020-06-29: 325 mg via ORAL
  Filled 2020-06-29: qty 1

## 2020-06-29 MED ORDER — HYDROMORPHONE HCL 1 MG/ML IJ SOLN: 0.5000 mg | INTRAMUSCULAR | Status: DC | PRN

## 2020-06-29 MED ORDER — DEXAMETHASONE SODIUM PHOSPHATE 10 MG/ML IJ SOLN: 10.0000 mg | Freq: Once | INTRAMUSCULAR | Status: DC

## 2020-06-29 MED ORDER — BISACODYL 10 MG RE SUPP: 10.0000 mg | Freq: Every day | RECTAL | Status: DC | PRN

## 2020-06-29 MED ORDER — CELECOXIB 200 MG PO CAPS
200.0000 mg | ORAL_CAPSULE | Freq: Two times a day (BID) | ORAL | Status: DC
  Administered 2020-06-29: 200 mg via ORAL
  Filled 2020-06-29: qty 1

## 2020-06-29 MED ORDER — MAGNESIUM CITRATE PO SOLN: 1.0000 | Freq: Once | ORAL | Status: DC | PRN

## 2020-06-29 MED ORDER — SODIUM CHLORIDE 0.9 % IV BOLUS
500.0000 mL | Freq: Once | INTRAVENOUS | Status: AC
  Administered 2020-06-29: 500 mL via INTRAVENOUS

## 2020-06-29 MED ORDER — DOCUSATE SODIUM 100 MG PO CAPS
100.0000 mg | ORAL_CAPSULE | Freq: Two times a day (BID) | ORAL | Status: DC
  Administered 2020-06-29: 100 mg via ORAL
  Filled 2020-06-29: qty 1

## 2020-06-29 MED ORDER — ASPIRIN 81 MG PO CHEW
81.0000 mg | CHEWABLE_TABLET | Freq: Two times a day (BID) | ORAL | Status: DC
  Administered 2020-06-29: 81 mg via ORAL
  Filled 2020-06-29: qty 1

## 2020-06-29 MED ORDER — ALUM & MAG HYDROXIDE-SIMETH 200-200-20 MG/5ML PO SUSP: 15.0000 mL | ORAL | Status: DC | PRN

## 2020-06-29 MED ORDER — POLYETHYLENE GLYCOL 3350 17 G PO PACK
17.0000 g | PACK | Freq: Two times a day (BID) | ORAL | Status: DC
  Administered 2020-06-29: 17 g via ORAL
  Filled 2020-06-29: qty 1

## 2020-06-29 MED ORDER — DIPHENHYDRAMINE HCL 12.5 MG/5ML PO ELIX: 12.5000 mg | ORAL_SOLUTION | ORAL | Status: DC | PRN

## 2020-06-29 NOTE — Progress Notes (Signed)
Physical Therapy Treatment Patient Details Name: Jacqueline Tucker MRN: [id number] DOB: 1951-06-21 Today's Date: 06/29/2020    History of Present Illness s/p L DA THA. PMH: decompressive lum lam L3-5, RA    PT Comments    Pt able to bring LLE to EOB with BUE assisting and increased time due to LLE slowly mobilizing towards EOB. Pt rises from EOB with BUE assisting to power up, good hand placement. Pt ambualtes in room and hallway with slow, short step to pattern, requiring increased time, reports pain up to 5/10 with weight-bearing. Pt denies dizziness with standing or ambulation, no diaphoresis noted, but does report pain is making her nauseas. Pt tolerates remaining up in chair with BLE elevated, ice returned, educated on short and frequent mobility to reduce stiffness and soreness at home. Pt appears to be ready for d/c home with spouse to assist; RN notified.   Follow Up Recommendations  Follow surgeon's recommendation for DC plan and follow-up therapies     Equipment Recommendations  None recommended by PT    Recommendations for Other Services       Precautions / Restrictions Precautions Precautions: Fall Restrictions Weight Bearing Restrictions: No    Mobility  Bed Mobility Overal bed mobility: Needs Assistance Bed Mobility: Supine to Sit  Supine to sit: Min guard    General bed mobility comments: LLE slow to move to EOB so pt uses BUE to assist LLE off of bed, no physical assist or cues  Transfers Overall transfer level: Needs assistance Equipment used: Rolling walker (2 wheeled) Transfers: Sit to/from Stand Sit to Stand: Min guard    General transfer comment: slow to rise with BUE assisting to power up, hip extension last  Ambulation/Gait Ambulation/Gait assistance: Min guard Gait Distance (Feet): 60 Feet Assistive device: Rolling walker (2 wheeled) Gait Pattern/deviations: Step-to pattern;Decreased stance time - left;Decreased weight shift to left Gait velocity:  decreased   General Gait Details: short, slow step-to pattern, decreased L stance time and weight-shift to L side, slow and cautious with turns, no overt LOB or dizziness   Stairs             Wheelchair Mobility    Modified Rankin (Stroke Patients Only)       Balance Overall balance assessment: Needs assistance Sitting-balance support: Feet supported;No upper extremity supported Sitting balance-Leahy Scale: Good Sitting balance - Comments: seated EOB   Standing balance support: During functional activity;Bilateral upper extremity supported Standing balance-Leahy Scale: Poor Standing balance comment: reliant on UE support         Cognition Arousal/Alertness: Awake/alert Behavior During Therapy: WFL for tasks assessed/performed Overall Cognitive Status: Within Functional Limits for tasks assessed     Exercises      General Comments        Pertinent Vitals/Pain Pain Assessment: 0-10 Pain Score: 5  Pain Location: L hip with mobility Pain Descriptors / Indicators: Aching;Sore;Tightness Pain Intervention(s): Limited activity within patient's tolerance;Monitored during session;Premedicated before session;Ice applied    Home Living                      Prior Function            PT Goals (current goals can now be found in the care plan section) Acute Rehab PT Goals Patient Stated Goal: less hip pain PT Goal Formulation: With patient Time For Goal Achievement: 07/05/20 Potential to Achieve Goals: Good Progress towards PT goals: Progressing toward goals    Frequency    7X/week  PT Plan Current plan remains appropriate    Co-evaluation              AM-PAC PT "6 Clicks" Mobility   Outcome Measure  Help needed turning from your back to your side while in a flat bed without using bedrails?: None Help needed moving from lying on your back to sitting on the side of a flat bed without using bedrails?: None Help needed moving to and  from a bed to a chair (including a wheelchair)?: None Help needed standing up from a chair using your arms (e.g., wheelchair or bedside chair)?: A Little Help needed to walk in hospital room?: A Little Help needed climbing 3-5 steps with a railing? : A Lot 6 Click Score: 20    End of Session Equipment Utilized During Treatment: Gait belt Activity Tolerance: Patient tolerated treatment well Patient left: with call bell/phone within reach;in chair Nurse Communication: Mobility status PT Visit Diagnosis: Difficulty in walking, not elsewhere classified (R26.2);Other abnormalities of gait and mobility (R26.89)     Time: 8887-5797 PT Time Calculation (min) (ACUTE ONLY): 19 min  Charges:  $Gait Training: 8-22 mins                     Tori Carle Fenech PT, DPT 06/29/20, 9:07 AM

## 2020-06-29 NOTE — Progress Notes (Signed)
Patient discharged to home w/ spouse. Given all belongings, instructions, equipment. Verbalized understanding of instructions. Escorted to pov via w/c.

## 2020-06-29 NOTE — TOC Transition Note (Signed)
Transition of Care [facility]) - CM/SW Discharge Note   Patient Details  Name: MARLAINA COBURN MRN: [id number] Date of Birth: Feb 03, 1951  Transition of Care [facility]) CM/SW Contact:  Lia Hopping, [place] Phone Number: 06/29/2020, 10:34 AM   Clinical Narrative:    Therapy Plan: HEP CSW confirm the pt. RW and 3 in 1 at bedside.   Final next level of care: Home/Self Care (HEP) Barriers to Discharge: Barriers Resolved   Patient Goals and CMS Choice     Choice offered to / list presented to : NA  Discharge Placement                       Discharge Plan and Services                DME Arranged: 3-N-1, Walker rolling DME Agency: [employer] Date DME Agency Contacted: 06/28/20   Representative spoke with at DME Agency: Ovid Curd (PACU closet) HH Arranged: NA [place] Agency: NA        Social Determinants of Health (SDOH) Interventions     Readmission Risk Interventions No flowsheet data found.

## 2020-06-29 NOTE — Progress Notes (Signed)
° ° ° °  Subjective: 1 Day Post-Op Procedure(s) (LRB): TOTAL HIP ARTHROPLASTY ANTERIOR APPROACH (Left)   Patient reports pain as mild/mod.  C/o more muscle spasms than groin pain. States that she had a head yesterday (she states that she is prone to them), but no headache this morning while sitting up in bed.  No reported events throughout the night otherwise.  Discussed the procedure, findings and expectations moving forward.  Ready to be discharged home, if they do well with PT.  Follow up in the clinic in 2 weeks.  Knows to call with any questions or concerns.     Patient's anticipated LOS is less than 2 midnights, meeting these requirements: - Lives within 1 hour of care - Has a competent adult at home to recover with post-op recover - NO history of  - Chronic pain requiring opiods  - Diabetes  - Coronary Artery Disease  - Heart failure  - Heart attack  - Stroke  - DVT/VTE  - Cardiac arrhythmia  - Respiratory Failure/COPD  - Renal failure  - Anemia  - Advanced Liver disease       Objective:   VITALS:   Vitals:   06/29/20 0201 06/29/20 0616  BP: 123/68 101/61  Pulse: 77 72  Resp: 16 15  Temp: 97.9 F (36.6 C) (!) 97.4 F (36.3 C)  SpO2: 100% 100%   Muscle spasms of the anterior and medial thigh Dorsiflexion/Plantar flexion intact Incision: dressing C/D/I No cellulitis present Compartment soft  LABS (labs ordered for this morning) No results for input(s): HGB, HCT, WBC, PLT in the last 72 hours.  No results for input(s): NA, K, BUN, CREATININE, GLUCOSE in the last 72 hours.   Assessment/Plan: 1 Day Post-Op Procedure(s) (LRB): TOTAL HIP ARTHROPLASTY ANTERIOR APPROACH (Left) Labs ordered this morning Foley cath d/c'ed 500 cc bolus Advance diet Up with therapy Discharge home if she does well with PT      Danae Orleans PA-C  [facility]   [place] [address], [address], [place],  [address] Phone:  [phone] [email or web address]   [employer]

## 2020-06-29 NOTE — Care Management Important Message (Signed)
Important Message  Patient Details IM Letter given to the Patient Name: Jacqueline Tucker MRN: [id number] Date of Birth: Oct 02, 1951   Medicare Important Message Given:  Yes     Kerin Salen 06/29/2020, 11:55 AM

## 2020-06-30 NOTE — Discharge Summary (Signed)
Patient ID: Jacqueline Tucker MRN: [id number] DOB/AGE: Mar 14, 1951 69 y.o.  Admit date: 06/28/2020 Discharge date: 06/30/2020  Admission Diagnoses:  Principal Problem:   Left hip OA Active Problems:   S/P left THA, AA   S/P total hip arthroplasty   Discharge Diagnoses:  Same  Past Medical History:  Diagnosis Date  . Abnormal Pap smear of cervix 10/99   LGSIL  . Arthritis   . Basal cell carcinoma (BCC)    On hand  . Complication of anesthesia    Headache  . GERD (gastroesophageal reflux disease)   . Headache(784.0)    migraines  . Hypertension   . Osteoporosis   . Pneumonia   . PONV (postoperative nausea and vomiting)   . Rheumatoid arthritis ([place])     Surgeries: Procedure(s): LEFT TOTAL HIP ARTHROPLASTY ANTERIOR APPROACH on 06/28/2020   Consultants:  N/A  Discharged Condition: Improved  Hospital Course: Jacqueline Tucker is an 69 y.o. female who was admitted 06/28/2020 for operative treatment ofOsteoarthritis of left hip. Patient has severe unremitting pain that affects sleep, daily activities, and work/hobbies. After pre-op clearance the patient was taken to the operating room on 06/28/2020 and underwent  Procedure(s): LEFT TOTAL HIP ARTHROPLASTY ANTERIOR APPROACH.    Patient was given perioperative antibiotics:  Anti-infectives (From admission, onward)   Start     Dose/Rate Route Frequency Ordered Stop   06/28/20 1600  ceFAZolin (ANCEF) IVPB 2g/100 mL premix        2 g 200 mL/hr over 30 Minutes Intravenous Every 6 hours 06/28/20 1143 06/28/20 2209   06/28/20 1525  ceFAZolin (ANCEF) 2-4 GM/100ML-% IVPB       Note to Pharmacy: Floreen Comber   : cabinet override      06/28/20 1525 06/28/20 1751   06/28/20 0645  ceFAZolin (ANCEF) IVPB 2g/100 mL premix        2 g 200 mL/hr over 30 Minutes Intravenous On call to O.R. 06/28/20 0636 06/28/20 1030       Patient was given sequential compression devices, early ambulation, and chemoprophylaxis to prevent DVT.  Patient  benefited maximally from hospital stay and there were no complications.    Recent vital signs:  Patient Vitals for the past 24 hrs:  BP Temp Temp src Pulse Resp SpO2  06/29/20 0952 (!) 110/55 97.7 F (36.5 C) Oral 80 18 100 %     Recent laboratory studies:  Recent Labs    06/29/20 0827  WBC 7.7  HGB 9.0*  HCT 26.6*  PLT 132*  NA 136  K 3.6  CL 103  CO2 22  BUN 15  CREATININE 0.58  GLUCOSE 155*  CALCIUM 7.8*     Discharge Medications:   Allergies as of 06/29/2020      Reactions   Codeine    fainting   Latex Itching   Neosporin [neomycin-bacitracin Zn-polymyx] Rash      Medication List    STOP taking these medications   methotrexate 2.5 MG tablet Commonly known as: RHEUMATREX   VITAMIN D PO     TAKE these medications   acetaminophen 500 MG tablet Commonly known as: TYLENOL Take 2 tablets (1,000 mg total) by mouth every 8 (eight) hours.   aspirin 81 MG chewable tablet Commonly known as: Aspirin Childrens Chew 1 tablet (81 mg total) by mouth 2 (two) times daily. Take for 4 weeks, then resume regular dose.   BIOTIN PO Take 1 tablet by mouth daily.   celecoxib 200 MG capsule Commonly known as:  CELEBREX Take 200 mg by mouth 2 (two) times daily.   denosumab 60 MG/ML Soln injection Commonly known as: PROLIA Inject 60 mg into the skin every 6 (six) months. Administer in upper arm, thigh, or abdomen   docusate sodium 100 MG capsule Commonly known as: Colace Take 1 capsule (100 mg total) by mouth 2 (two) times daily.   ferrous sulfate 325 (65 FE) MG tablet Commonly known as: FerrouSul Take 1 tablet (325 mg total) by mouth 3 (three) times daily with meals for 14 days.   FISH OIL PO Take 1,000 mg by mouth daily.   folic acid 1 MG tablet Commonly known as: FOLVITE Take 2 mg by mouth daily.   lisinopril 10 MG tablet Commonly known as: ZESTRIL Take 10 mg by mouth daily.   methocarbamol 500 MG tablet Commonly known as: Robaxin Take 1 tablet (500 mg  total) by mouth every 6 (six) hours as needed for muscle spasms.   multivitamin with minerals Tabs tablet Take 1 tablet by mouth every other day.   polyethylene glycol 17 g packet Commonly known as: MIRALAX / GLYCOLAX Take 17 g by mouth 2 (two) times daily.   Rinvoq 15 MG Tb24 Generic drug: Upadacitinib ER Take 15 mg by mouth daily.   traMADol 50 MG tablet Commonly known as: Ultram Take 1 tablet (50 mg total) by mouth every 6 (six) hours as needed.   Vitamin D 50 MCG (2000 UT) Caps Take 2,000 Units by mouth daily.            Discharge Care Instructions  (From admission, onward)         Start     Ordered   06/28/20 0000  Change dressing       Comments: Maintain surgical dressing until follow up in the clinic. If the edges start to pull up, may reinforce with tape. If the dressing is no longer working, may remove and cover with gauze and tape, but must keep the area dry and clean.  Call with any questions or concerns.   06/28/20 3557          Diagnostic Studies: DG Pelvis Portable  Result Date: 06/28/2020 CLINICAL DATA:  Status post left total hip replacement. EXAM: PORTABLE PELVIS 1-2 VIEWS COMPARISON:  None. FINDINGS: The left femoral and acetabular components appear to be well situated. Expected postoperative changes are seen in the surrounding soft tissues. IMPRESSION: Status post left total hip arthroplasty. Electronically Signed   By: Marijo Conception M.D.   On: 06/28/2020 12:35   DG C-Arm 1-60 Min-No Report  Result Date: 06/28/2020 Fluoroscopy was utilized by the requesting physician.  No radiographic interpretation.   DG HIP OPERATIVE UNILAT W OR W/O PELVIS LEFT  Result Date: 06/28/2020 CLINICAL DATA:  69 year old female undergoing left hip arthroplasty. EXAM: OPERATIVE LEFT HIP (WITH PELVIS IF PERFORMED) 2 VIEWS TECHNIQUE: Fluoroscopic spot image(s) were submitted for interpretation post-operatively. COMPARISON:  LUMBAR RADIOGRAPHS 11/11/2013 AND EARLIER  FLUOROSCOPY TIME:  0 minutes 14 seconds FINDINGS: Two intraoperative AP fluoroscopic spot views of the left hip in the lower pelvis at 1112 hours. Left total hip arthroplasty underway. No adverse hardware features at this time. No unexpected osseous changes. IMPRESSION: Left total hip arthroplasty underway with no adverse features. Electronically Signed   By: Genevie Ann M.D.   On: 06/28/2020 11:34    Disposition: Home  Discharge Instructions    Call MD / Call 911   Complete by: As directed    If you experience  chest pain or shortness of breath, CALL 911 and be transported to the hospital emergency room.  If you develope a fever above 101 F, pus (white drainage) or increased drainage or redness at the wound, or calf pain, call your surgeon's office.   Change dressing   Complete by: As directed    Maintain surgical dressing until follow up in the clinic. If the edges start to pull up, may reinforce with tape. If the dressing is no longer working, may remove and cover with gauze and tape, but must keep the area dry and clean.  Call with any questions or concerns.   Constipation Prevention   Complete by: As directed    Drink plenty of fluids.  Prune juice may be helpful.  You may use a stool softener, such as Colace (over the counter) 100 mg twice a day.  Use MiraLax (over the counter) for constipation as needed.   Diet - low sodium heart healthy   Complete by: As directed    Discharge instructions   Complete by: As directed    Maintain surgical dressing until follow up in the clinic. If the edges start to pull up, may reinforce with tape. If the dressing is no longer working, may remove and cover with gauze and tape, but must keep the area dry and clean.  Follow up in 2 weeks at [facility]. Call with any questions or concerns.   Increase activity slowly as tolerated   Complete by: As directed    Weight bearing as tolerated with assist device (walker, cane, etc) as directed, use it as long as  suggested by your surgeon or therapist, typically at least 4-6 weeks.   TED hose   Complete by: As directed    Use stockings (TED hose) for 2 weeks on both leg(s).  You may remove them at night for sleeping.       Follow-up Information    Paralee Cancel, MD. Schedule an appointment as soon as possible for a visit in 2 weeks.   Specialty: Orthopedic Surgery Contact information: [address] [place] [address] [phone]                Signed: Lucille Passy [facility] 06/30/2020, 9:19 AM

## 2020-08-15 DIAGNOSIS — M25552 Pain in left hip: Secondary | ICD-10-CM | POA: Diagnosis not present

## 2020-08-15 DIAGNOSIS — Z96642 Presence of left artificial hip joint: Secondary | ICD-10-CM | POA: Diagnosis not present

## 2020-08-15 DIAGNOSIS — Z471 Aftercare following joint replacement surgery: Secondary | ICD-10-CM | POA: Diagnosis not present

## 2020-09-06 DIAGNOSIS — E663 Overweight: Secondary | ICD-10-CM | POA: Diagnosis not present

## 2020-09-06 DIAGNOSIS — Z6825 Body mass index (BMI) 25.0-25.9, adult: Secondary | ICD-10-CM | POA: Diagnosis not present

## 2020-09-06 DIAGNOSIS — M150 Primary generalized (osteo)arthritis: Secondary | ICD-10-CM | POA: Diagnosis not present

## 2020-09-06 DIAGNOSIS — Z79899 Other long term (current) drug therapy: Secondary | ICD-10-CM | POA: Diagnosis not present

## 2020-09-06 DIAGNOSIS — M0579 Rheumatoid arthritis with rheumatoid factor of multiple sites without organ or systems involvement: Secondary | ICD-10-CM | POA: Diagnosis not present

## 2020-09-06 DIAGNOSIS — M2550 Pain in unspecified joint: Secondary | ICD-10-CM | POA: Diagnosis not present

## 2020-09-06 DIAGNOSIS — M810 Age-related osteoporosis without current pathological fracture: Secondary | ICD-10-CM | POA: Diagnosis not present

## 2020-09-07 DIAGNOSIS — Z20822 Contact with and (suspected) exposure to covid-19: Secondary | ICD-10-CM | POA: Diagnosis not present

## 2020-09-21 DIAGNOSIS — Z96642 Presence of left artificial hip joint: Secondary | ICD-10-CM | POA: Diagnosis not present

## 2020-09-21 DIAGNOSIS — Z471 Aftercare following joint replacement surgery: Secondary | ICD-10-CM | POA: Diagnosis not present

## 2020-10-05 DIAGNOSIS — Z96642 Presence of left artificial hip joint: Secondary | ICD-10-CM | POA: Diagnosis not present

## 2020-10-05 DIAGNOSIS — Z471 Aftercare following joint replacement surgery: Secondary | ICD-10-CM | POA: Diagnosis not present

## 2020-10-06 DIAGNOSIS — Z1231 Encounter for screening mammogram for malignant neoplasm of breast: Secondary | ICD-10-CM | POA: Diagnosis not present

## 2020-10-10 DIAGNOSIS — H10412 Chronic giant papillary conjunctivitis, left eye: Secondary | ICD-10-CM | POA: Diagnosis not present

## 2020-11-02 DIAGNOSIS — Z1389 Encounter for screening for other disorder: Secondary | ICD-10-CM | POA: Diagnosis not present

## 2020-11-02 DIAGNOSIS — Z0000 Encounter for general adult medical examination without abnormal findings: Secondary | ICD-10-CM | POA: Diagnosis not present

## 2020-11-03 DIAGNOSIS — H04562 Stenosis of left lacrimal punctum: Secondary | ICD-10-CM | POA: Diagnosis not present

## 2020-11-08 ENCOUNTER — Other Ambulatory Visit: Payer: Self-pay | Admitting: Family Medicine

## 2020-11-08 DIAGNOSIS — M8580 Other specified disorders of bone density and structure, unspecified site: Secondary | ICD-10-CM

## 2020-11-23 DIAGNOSIS — M810 Age-related osteoporosis without current pathological fracture: Secondary | ICD-10-CM | POA: Diagnosis not present

## 2020-11-28 ENCOUNTER — Ambulatory Visit: Payer: Medicare PPO | Admitting: Podiatry

## 2020-11-28 ENCOUNTER — Ambulatory Visit (INDEPENDENT_AMBULATORY_CARE_PROVIDER_SITE_OTHER): Payer: Medicare PPO

## 2020-11-28 ENCOUNTER — Other Ambulatory Visit: Payer: Self-pay

## 2020-11-28 DIAGNOSIS — M2061 Acquired deformities of toe(s), unspecified, right foot: Secondary | ICD-10-CM

## 2020-11-28 DIAGNOSIS — M05771 Rheumatoid arthritis with rheumatoid factor of right ankle and foot without organ or systems involvement: Secondary | ICD-10-CM

## 2020-12-02 ENCOUNTER — Encounter: Payer: Self-pay | Admitting: Podiatry

## 2020-12-02 NOTE — Progress Notes (Signed)
Subjective:   Patient ID: Jacqueline Tucker, female   DOB: 70 y.o.   MRN: [id number]   HPI 70 year old female presents the office today for concerns of her right second third toes and spreading like a "V".  They do become uncomfortable to do her shoes.  She does have rheumatoid arthritis.  She does state that she will try to occasionally tape the toes together to help prevent them from splaying.  No recent injuries.  No other concerns today.   Review of Systems  All other systems reviewed and are negative.  Past Medical History:  Diagnosis Date  . Abnormal Pap smear of cervix 10/99   LGSIL  . Arthritis   . Basal cell carcinoma (BCC)    On hand  . Complication of anesthesia    Headache  . GERD (gastroesophageal reflux disease)   . Headache(784.0)    migraines  . Hypertension   . Osteoporosis   . Pneumonia   . PONV (postoperative nausea and vomiting)   . Rheumatoid arthritis [facility])     Past Surgical History:  Procedure Laterality Date  . CRYOTHERAPY  1984   dysplasia  . DECOMPRESSIVE LUMBAR LAMINECTOMY LEVEL 2 N/A 11/11/2013   Procedure: DECOMPRESSIVE LUMBAR LAMINECTOMY L3-L5;  Surgeon: Melina Schools, MD;  Location: [facility];  Service: Orthopedics;  Laterality: N/A;  . DILATION AND CURETTAGE OF UTERUS  1990   SAB  . LUMBAR LAMINECTOMY  11/11/2013   L3   L5     DR BROOKS  . SKIN CANCER EXCISION     Hand  . TOTAL HIP ARTHROPLASTY Left 06/28/2020   Procedure: TOTAL HIP ARTHROPLASTY ANTERIOR APPROACH;  Surgeon: Paralee Cancel, MD;  Location: [facility];  Service: Orthopedics;  Laterality: Left;  70 mins  . WISDOM TOOTH EXTRACTION  1968     Current Outpatient Medications:  .  acetaminophen (TYLENOL) 500 MG tablet, Take 2 tablets (1,000 mg total) by mouth every 8 (eight) hours., Disp: 30 tablet, Rfl: 0 .  BIOTIN PO, Take 1 tablet by mouth daily. , Disp: , Rfl:  .  celecoxib (CELEBREX) 200 MG capsule, Take 200 mg by mouth 2 (two) times daily. , Disp: , Rfl:  .  Cholecalciferol (VITAMIN D)  50 MCG (2000 UT) CAPS, Take 2,000 Units by mouth daily., Disp: , Rfl:  .  denosumab (PROLIA) 60 MG/ML SOLN injection, Inject 60 mg into the skin every 6 (six) months. Administer in upper arm, thigh, or abdomen, Disp: , Rfl:  .  docusate sodium (COLACE) 100 MG capsule, Take 1 capsule (100 mg total) by mouth 2 (two) times daily., Disp: 28 capsule, Rfl: 0 .  ferrous sulfate (FERROUSUL) 325 (65 FE) MG tablet, Take 1 tablet (325 mg total) by mouth 3 (three) times daily with meals for 14 days., Disp: 42 tablet, Rfl: 0 .  folic acid (FOLVITE) 1 MG tablet, Take 2 mg by mouth daily., Disp: , Rfl:  .  lisinopril (ZESTRIL) 10 MG tablet, Take 10 mg by mouth daily. , Disp: , Rfl:  .  methocarbamol (ROBAXIN) 500 MG tablet, Take 1 tablet (500 mg total) by mouth every 6 (six) hours as needed for muscle spasms., Disp: 40 tablet, Rfl: 0 .  Multiple Vitamin (MULTIVITAMIN WITH MINERALS) TABS tablet, Take 1 tablet by mouth every other day. , Disp: , Rfl:  .  Omega-3 Fatty Acids (FISH OIL PO), Take 1,000 mg by mouth daily. , Disp: , Rfl:  .  polyethylene glycol (MIRALAX / GLYCOLAX) 17 g packet, Take  17 g by mouth 2 (two) times daily., Disp: 28 packet, Rfl: 0 .  RINVOQ 15 MG TB24, Take 15 mg by mouth daily. , Disp: , Rfl:  .  traMADol (ULTRAM) 50 MG tablet, Take 1 tablet (50 mg total) by mouth every 6 (six) hours as needed., Disp: 20 tablet, Rfl: 0  Allergies  Allergen Reactions  . Codeine     fainting  . Latex Itching  . Neosporin [Neomycin-Bacitracin Zn-Polymyx] Rash         Objective:  Physical Exam  General: AAO x3, NAD  Dermatological: Skin is warm, dry and supple bilateral. There are no open sores, no preulcerative lesions, no rash or signs of infection present.  Vascular: Dorsalis Pedis artery and Posterior Tibial artery pedal pulses are 2/4 bilateral with immedate capillary fill time. There is no pain with calf compression, swelling, warmth, erythema.   Neruologic: Grossly intact via light touch  bilateral.   Musculoskeletal: There is splaying of the 2nd and 3rd digits on the right foot with fibular deviation of the lesser digits 3-4 on the right.  There is no significant area of tenderness identified at this time.  No edema, erythema.  No palpable neuroma.  Muscular strength 5/5 in all groups tested bilateral.  Gait: Unassisted, Nonantalgic.     Assessment:   Right foot digital deformity, rheumatoid arthritis    Plan:  -Treatment options discussed including all alternatives, risks, and complications -Etiology of symptoms were discussed -X-rays obtained reviewed.  No evidence of acute fracture.  Splaying of the digits noted.  Erosive changes of the metatarsal heads of the lesser digits -We discussed with conservative as well as surgical treatment options.  Conservative discussed various offloading pads to help pull the toe straight.  Discussed also socks with toe separator such as Yoga toes or Happyfeet.  Discussed shoe modifications as well.  Considerably we discussed straightening of the toe but no guarantee this will stay straight afterwards.  She is to continue conservative treatment for now but if it becomes worse we will consider surgical intervention.    Trula Slade DPM

## 2020-12-14 DIAGNOSIS — M810 Age-related osteoporosis without current pathological fracture: Secondary | ICD-10-CM | POA: Diagnosis not present

## 2020-12-14 DIAGNOSIS — Z6825 Body mass index (BMI) 25.0-25.9, adult: Secondary | ICD-10-CM | POA: Diagnosis not present

## 2020-12-14 DIAGNOSIS — M150 Primary generalized (osteo)arthritis: Secondary | ICD-10-CM | POA: Diagnosis not present

## 2020-12-14 DIAGNOSIS — Z79899 Other long term (current) drug therapy: Secondary | ICD-10-CM | POA: Diagnosis not present

## 2020-12-14 DIAGNOSIS — I7300 Raynaud's syndrome without gangrene: Secondary | ICD-10-CM | POA: Diagnosis not present

## 2020-12-14 DIAGNOSIS — E663 Overweight: Secondary | ICD-10-CM | POA: Diagnosis not present

## 2020-12-14 DIAGNOSIS — M2550 Pain in unspecified joint: Secondary | ICD-10-CM | POA: Diagnosis not present

## 2020-12-14 DIAGNOSIS — M0579 Rheumatoid arthritis with rheumatoid factor of multiple sites without organ or systems involvement: Secondary | ICD-10-CM | POA: Diagnosis not present

## 2020-12-21 DIAGNOSIS — H9191 Unspecified hearing loss, right ear: Secondary | ICD-10-CM | POA: Diagnosis not present

## 2020-12-21 DIAGNOSIS — H6121 Impacted cerumen, right ear: Secondary | ICD-10-CM | POA: Diagnosis not present

## 2021-01-03 DIAGNOSIS — M810 Age-related osteoporosis without current pathological fracture: Secondary | ICD-10-CM | POA: Diagnosis not present

## 2021-01-03 DIAGNOSIS — M85851 Other specified disorders of bone density and structure, right thigh: Secondary | ICD-10-CM | POA: Diagnosis not present

## 2021-03-22 DIAGNOSIS — M0579 Rheumatoid arthritis with rheumatoid factor of multiple sites without organ or systems involvement: Secondary | ICD-10-CM | POA: Diagnosis not present

## 2021-03-22 DIAGNOSIS — Z79899 Other long term (current) drug therapy: Secondary | ICD-10-CM | POA: Diagnosis not present

## 2021-03-22 DIAGNOSIS — I7300 Raynaud's syndrome without gangrene: Secondary | ICD-10-CM | POA: Diagnosis not present

## 2021-03-22 DIAGNOSIS — M150 Primary generalized (osteo)arthritis: Secondary | ICD-10-CM | POA: Diagnosis not present

## 2021-03-22 DIAGNOSIS — E663 Overweight: Secondary | ICD-10-CM | POA: Diagnosis not present

## 2021-03-22 DIAGNOSIS — M5410 Radiculopathy, site unspecified: Secondary | ICD-10-CM | POA: Diagnosis not present

## 2021-03-22 DIAGNOSIS — M2550 Pain in unspecified joint: Secondary | ICD-10-CM | POA: Diagnosis not present

## 2021-03-22 DIAGNOSIS — M810 Age-related osteoporosis without current pathological fracture: Secondary | ICD-10-CM | POA: Diagnosis not present

## 2021-03-22 DIAGNOSIS — Z6825 Body mass index (BMI) 25.0-25.9, adult: Secondary | ICD-10-CM | POA: Diagnosis not present

## 2021-04-06 DIAGNOSIS — M5459 Other low back pain: Secondary | ICD-10-CM | POA: Diagnosis not present

## 2021-04-06 DIAGNOSIS — M961 Postlaminectomy syndrome, not elsewhere classified: Secondary | ICD-10-CM | POA: Diagnosis not present

## 2021-04-17 DIAGNOSIS — D2262 Melanocytic nevi of left upper limb, including shoulder: Secondary | ICD-10-CM | POA: Diagnosis not present

## 2021-04-17 DIAGNOSIS — D2261 Melanocytic nevi of right upper limb, including shoulder: Secondary | ICD-10-CM | POA: Diagnosis not present

## 2021-04-17 DIAGNOSIS — L821 Other seborrheic keratosis: Secondary | ICD-10-CM | POA: Diagnosis not present

## 2021-04-17 DIAGNOSIS — D225 Melanocytic nevi of trunk: Secondary | ICD-10-CM | POA: Diagnosis not present

## 2021-04-17 DIAGNOSIS — D1801 Hemangioma of skin and subcutaneous tissue: Secondary | ICD-10-CM | POA: Diagnosis not present

## 2021-04-17 DIAGNOSIS — L570 Actinic keratosis: Secondary | ICD-10-CM | POA: Diagnosis not present

## 2021-05-01 DIAGNOSIS — M5459 Other low back pain: Secondary | ICD-10-CM | POA: Diagnosis not present

## 2021-05-08 DIAGNOSIS — M961 Postlaminectomy syndrome, not elsewhere classified: Secondary | ICD-10-CM | POA: Diagnosis not present

## 2021-05-08 DIAGNOSIS — M5416 Radiculopathy, lumbar region: Secondary | ICD-10-CM | POA: Diagnosis not present

## 2021-05-16 DIAGNOSIS — M545 Low back pain, unspecified: Secondary | ICD-10-CM | POA: Diagnosis not present

## 2021-06-05 NOTE — Progress Notes (Signed)
70 y.o. G38P1021 Married Caucasian female here for Breast and pelvic exam.    No vaginal bleeding.   Needs a new hip.  Having pain.  Dr. Trudie Reed is prescribing her Prolia.   She is followed her for her low vit D level through this office.  Her level was 94.4 last year and I recommend decreasing her intake from 5000 IU to 2000 IU daily.  New grandson in [place].   PCP:   Milagros Evener, MD  Patient's last menstrual period was 10/22/1998.           Sexually active: Yes.   Occ. The current method of family planning is post menopausal status.    Exercising: No.   Some walking--having hip problems Smoker:  no  Health Maintenance: Pap: 05-11-19 Neg:New HR HPV, 01-15-17 Neg, 01-10-15 Neg:Neg HR HPV History of abnormal Pap:  yes, 1984 Hx of cryotherapy to cervix.  Hx of atypical paps following this.  MMG: 10-06-20 3D/Neg/BiRads1 Colonoscopy:  BMD: 2021 Result :osteoporosis w/Dr.Hawkes--on Prolia TDaP:  01-15-17 Gardasil:   no HIV: Unsure Hep C: unsure Screening Labs:  PCP and endocrinology.   reports that she has never smoked. She has never used smokeless tobacco. She reports that she does not drink alcohol and does not use drugs.  Past Medical History:  Diagnosis Date   Abnormal Pap smear of cervix 10/99   LGSIL   Arthritis    Basal cell carcinoma (BCC)    On hand   Complication of anesthesia    Headache   GERD (gastroesophageal reflux disease)    Headache(784.0)    migraines   Hypertension    Osteoporosis    Pneumonia    PONV (postoperative nausea and vomiting)    Rheumatoid arthritis ([place])     Past Surgical History:  Procedure Laterality Date   CRYOTHERAPY  1984   dysplasia   DECOMPRESSIVE LUMBAR LAMINECTOMY LEVEL 2 N/A 11/11/2013   Procedure: DECOMPRESSIVE LUMBAR LAMINECTOMY L3-L5;  Surgeon: Melina Schools, MD;  Location: [facility];  Service: Orthopedics;  Laterality: N/A;   DILATION AND CURETTAGE OF UTERUS  1990   SAB   LUMBAR LAMINECTOMY  11/11/2013   L3   L5      DR BROOKS   SKIN CANCER EXCISION     Hand   TOTAL HIP ARTHROPLASTY Left 06/28/2020   Procedure: TOTAL HIP ARTHROPLASTY ANTERIOR APPROACH;  Surgeon: Paralee Cancel, MD;  Location: [facility];  Service: Orthopedics;  Laterality: Left;  70 mins   WISDOM TOOTH EXTRACTION  1968    Current Outpatient Medications  Medication Sig Dispense Refill   BIOTIN PO Take 1 tablet by mouth daily.      celecoxib (CELEBREX) 200 MG capsule Take 200 mg by mouth 2 (two) times daily.      Cholecalciferol (VITAMIN D) 50 MCG (2000 UT) CAPS Take 2,000 Units by mouth daily.     denosumab (PROLIA) 60 MG/ML SOLN injection Inject 60 mg into the skin every 6 (six) months. Administer in upper arm, thigh, or abdomen     folic acid (FOLVITE) 1 MG tablet Take 2 mg by mouth daily.     gabapentin (NEURONTIN) 100 MG capsule Take 100 mg by mouth at bedtime.     lisinopril (ZESTRIL) 10 MG tablet Take 10 mg by mouth daily.      Multiple Vitamin (MULTIVITAMIN WITH MINERALS) TABS tablet Take 1 tablet by mouth every other day.      Omega-3 Fatty Acids (FISH OIL PO) Take 1,000 mg by mouth  daily.      RINVOQ 15 MG TB24 Take 15 mg by mouth daily.      acetaminophen (TYLENOL) 500 MG tablet Take 2 tablets (1,000 mg total) by mouth every 8 (eight) hours. 30 tablet 0   polyethylene glycol (MIRALAX / GLYCOLAX) 17 g packet Take 17 g by mouth 2 (two) times daily. 28 packet 0   No current facility-administered medications for this visit.    Family History  Problem Relation Age of Onset   Breast cancer Mother 33   Hypertension Mother    Thyroid disease Mother    Cancer Mother        ocular melanoma   Cancer Father        prostate   Hypertension Father    Thyroid disease Father    Heart attack Maternal Grandmother    Cancer Maternal Grandfather        blood   Cancer Paternal Grandmother        stomach    Review of Systems  All other systems reviewed and are negative.  Exam:   BP 138/84   Pulse (!) 57   Ht 5' 3.75" (1.619 m)   Wt  151 lb (68.5 kg)   LMP 10/22/1998   SpO2 100%   BMI 26.12 kg/m     General appearance: alert, cooperative and appears stated age Head: normocephalic, without obvious abnormality, atraumatic Neck: no adenopathy, supple, symmetrical, trachea midline and thyroid normal to inspection and palpation Lungs: clear to auscultation bilaterally Breasts: normal appearance, no masses or tenderness, No nipple retraction or dimpling, No nipple discharge or bleeding, No axillary adenopathy Heart: regular rate and rhythm Abdomen: soft, non-tender; no masses, no organomegaly Extremities: extremities normal, atraumatic, no cyanosis or edema Skin: skin color, texture, turgor normal. No rashes or lesions Lymph nodes: cervical, supraclavicular, and axillary nodes normal. Neurologic: grossly normal  Pelvic: External genitalia:  no lesions              No abnormal inguinal nodes palpated.              Urethra:  normal appearing urethra with no masses, tenderness or lesions              Bartholins and Skenes: normal                 Vagina: normal appearing vagina with normal color and discharge, no lesions              Cervix: no lesions              Pap taken: yes Bimanual Exam:  Uterus:  normal size, contour, position, consistency, mobility, non-tender              Adnexa: no mass, fullness, tenderness              Rectal exam: yes.  Confirms.              Anus:  normal sphincter tone, no lesions  Chaperone was present for exam:  amanda, CMA  Assessment:   Screening breast exam.  FH breast cancer in mother.  Pelvic exam with abnormal findings absent.  Rectal exam.  Cervical cancer screening.  Remote hx LGSIL and cryotherapy to cervix.  Osteoporosis.  On Prolia.  Dr. Trudie Reed.  RA.  On MTX.  Plan: Mammogram screening discussed. Self breast awareness reviewed. Pap and HR HPV as above. Guidelines for Calcium, Vitamin D, regular exercise program including cardiovascular and weight bearing  exercise.  Check Vit D level.  BMD through Dr. Trudie Reed. Follow up in 2 years and prn.    After visit summary provided.   10 total time was spent for this patient encounter, including preparation, face-to-face counseling with the patient, coordination of care, and documentation of the encounter.

## 2021-06-06 DIAGNOSIS — H10413 Chronic giant papillary conjunctivitis, bilateral: Secondary | ICD-10-CM | POA: Diagnosis not present

## 2021-06-06 DIAGNOSIS — H43813 Vitreous degeneration, bilateral: Secondary | ICD-10-CM | POA: Diagnosis not present

## 2021-06-06 DIAGNOSIS — H04123 Dry eye syndrome of bilateral lacrimal glands: Secondary | ICD-10-CM | POA: Diagnosis not present

## 2021-06-06 DIAGNOSIS — H5203 Hypermetropia, bilateral: Secondary | ICD-10-CM | POA: Diagnosis not present

## 2021-06-07 ENCOUNTER — Ambulatory Visit (INDEPENDENT_AMBULATORY_CARE_PROVIDER_SITE_OTHER): Payer: Medicare PPO | Admitting: Obstetrics and Gynecology

## 2021-06-07 ENCOUNTER — Other Ambulatory Visit (HOSPITAL_COMMUNITY)
Admission: RE | Admit: 2021-06-07 | Discharge: 2021-06-07 | Disposition: A | Discharge status: Home / Self Care | Payer: Medicare PPO | Source: Ambulatory Visit | Attending: Obstetrics and Gynecology | Admitting: Obstetrics and Gynecology

## 2021-06-07 ENCOUNTER — Encounter: Payer: Self-pay | Admitting: Obstetrics and Gynecology

## 2021-06-07 ENCOUNTER — Other Ambulatory Visit: Payer: Self-pay

## 2021-06-07 VITALS — BP 138/84 | HR 57 | Ht 63.75 in | Wt 151.0 lb

## 2021-06-07 DIAGNOSIS — M810 Age-related osteoporosis without current pathological fracture: Secondary | ICD-10-CM

## 2021-06-07 DIAGNOSIS — Z01419 Encounter for gynecological examination (general) (routine) without abnormal findings: Secondary | ICD-10-CM | POA: Diagnosis not present

## 2021-06-07 DIAGNOSIS — Z008 Encounter for other general examination: Secondary | ICD-10-CM

## 2021-06-07 DIAGNOSIS — Z1239 Encounter for other screening for malignant neoplasm of breast: Secondary | ICD-10-CM | POA: Diagnosis not present

## 2021-06-07 DIAGNOSIS — Z124 Encounter for screening for malignant neoplasm of cervix: Secondary | ICD-10-CM

## 2021-06-07 NOTE — Patient Instructions (Signed)

## 2021-06-08 LAB — CYTOLOGY - PAP: Diagnosis: NEGATIVE

## 2021-06-08 LAB — VITAMIN D 25 HYDROXY (VIT D DEFICIENCY, FRACTURES): Vit D, 25-Hydroxy: 54 ng/mL (ref 30–100)

## 2021-06-19 DIAGNOSIS — M810 Age-related osteoporosis without current pathological fracture: Secondary | ICD-10-CM | POA: Diagnosis not present

## 2021-06-22 DIAGNOSIS — M810 Age-related osteoporosis without current pathological fracture: Secondary | ICD-10-CM | POA: Diagnosis not present

## 2021-06-22 DIAGNOSIS — E663 Overweight: Secondary | ICD-10-CM | POA: Diagnosis not present

## 2021-06-22 DIAGNOSIS — Z79899 Other long term (current) drug therapy: Secondary | ICD-10-CM | POA: Diagnosis not present

## 2021-06-22 DIAGNOSIS — M0579 Rheumatoid arthritis with rheumatoid factor of multiple sites without organ or systems involvement: Secondary | ICD-10-CM | POA: Diagnosis not present

## 2021-06-22 DIAGNOSIS — M150 Primary generalized (osteo)arthritis: Secondary | ICD-10-CM | POA: Diagnosis not present

## 2021-06-22 DIAGNOSIS — Z6825 Body mass index (BMI) 25.0-25.9, adult: Secondary | ICD-10-CM | POA: Diagnosis not present

## 2021-06-22 DIAGNOSIS — I7300 Raynaud's syndrome without gangrene: Secondary | ICD-10-CM | POA: Diagnosis not present

## 2021-06-22 DIAGNOSIS — M545 Low back pain, unspecified: Secondary | ICD-10-CM | POA: Diagnosis not present

## 2021-07-12 DIAGNOSIS — M25551 Pain in right hip: Secondary | ICD-10-CM | POA: Diagnosis not present

## 2021-07-12 DIAGNOSIS — M1611 Unilateral primary osteoarthritis, right hip: Secondary | ICD-10-CM | POA: Diagnosis not present

## 2021-07-12 DIAGNOSIS — Z96642 Presence of left artificial hip joint: Secondary | ICD-10-CM | POA: Diagnosis not present

## 2021-07-21 DIAGNOSIS — M5416 Radiculopathy, lumbar region: Secondary | ICD-10-CM | POA: Diagnosis not present

## 2021-07-24 DIAGNOSIS — M545 Low back pain, unspecified: Secondary | ICD-10-CM | POA: Diagnosis not present

## 2021-08-07 DIAGNOSIS — R0989 Other specified symptoms and signs involving the circulatory and respiratory systems: Secondary | ICD-10-CM | POA: Diagnosis not present

## 2021-08-07 DIAGNOSIS — M169 Osteoarthritis of hip, unspecified: Secondary | ICD-10-CM | POA: Diagnosis not present

## 2021-08-07 DIAGNOSIS — Z01818 Encounter for other preprocedural examination: Secondary | ICD-10-CM | POA: Diagnosis not present

## 2021-08-14 DIAGNOSIS — L603 Nail dystrophy: Secondary | ICD-10-CM | POA: Diagnosis not present

## 2021-08-14 DIAGNOSIS — L570 Actinic keratosis: Secondary | ICD-10-CM | POA: Diagnosis not present

## 2021-08-14 DIAGNOSIS — L821 Other seborrheic keratosis: Secondary | ICD-10-CM | POA: Diagnosis not present

## 2021-08-16 DIAGNOSIS — R0989 Other specified symptoms and signs involving the circulatory and respiratory systems: Secondary | ICD-10-CM | POA: Diagnosis not present

## 2021-08-24 NOTE — Patient Instructions (Addendum)
DUE TO COVID-19 ONLY ONE VISITOR IS ALLOWED TO COME WITH YOU AND STAY IN THE WAITING ROOM ONLY DURING PRE OP AND PROCEDURE.   **NO VISITORS ARE ALLOWED IN THE SHORT STAY AREA OR RECOVERY ROOM!!**  IF YOU WILL BE ADMITTED INTO THE HOSPITAL YOU ARE ALLOWED ONLY TWO SUPPORT PEOPLE DURING VISITATION HOURS ONLY (7 AM -8PM)    Up to two visitors ages 65+ are allowed at one time in a patient's room.  The visitors may rotate out with other people throughout the day.  Additionally, up to two children between the ages of 19 and 9 are allowed and do not count toward the number of allowed visitors.  Children within this age range must be accompanied by an adult visitor.  One adult visitor may remain with the patient overnight and must be in the room by 8 PM.  COVID SWAB TESTING MUST BE COMPLETED ON:  fRIDAY, 09-08-21, Between the hours of 8 and 3  **MUST PRESENT COMPLETED FORM AT TESTING SITE**    [place] (backside of the building)  You are not required to quarantine, however you are required to wear a well-fitted mask when you are out and around people not in your household.  Hand Hygiene often Do NOT share personal items Notify your provider if you are in close contact with someone who has COVID or you develop fever 100.4 or greater, new onset of sneezing, cough, sore throat, shortness of breath or body aches.        Your procedure is scheduled on:  Tuesday, 09-12-21   Report to [facility] Main  Entrance     Report to admitting at 11:35 AM   Call this number if you have problems the morning of surgery [phone]   Do not eat food :After Midnight.   May have liquids until 11:15 AM day of surgery  CLEAR LIQUID DIET  Foods Allowed                                                                     Foods Excluded  Water, Black Coffee (no milk/no creamer) and tea, regular and decaf                              liquids that you cannot  Plain Jell-O in any  flavor  (No red)                         see through such as: Fruit ices (not with fruit pulp)                                 milk, soups, orange juice  Iced Popsicles (No red)                                    All solid food                             Apple juices  Sports drinks like Gatorade (No red) Lightly seasoned clear broth or consume(fat free) Sugar     Complete one Ensure drink the morning of surgery at 11:!5 AM  the day of surgery.      The day of surgery:  Drink ONE (1) Pre-Surgery Clear Ensure the morning of surgery. Drink in one sitting. Do not sip.  This drink was given to you during your hospital  pre-op appointment visit. Nothing else to drink after completing the Pre-Surgery Clear Ensure.          If you have questions, please contact your surgeon's office.     Oral Hygiene is also important to reduce your risk of infection.                                    Remember - BRUSH YOUR TEETH THE MORNING OF SURGERY WITH YOUR REGULAR TOOTHPASTE   Do NOT smoke after Midnight   Take these medicines the morning of surgery with A SIP OF WATER:  Gabapentin   Stop all vitamins and herbal supplements a week before surgery             You may not have any metal on your body including hair pins, jewelry, and body piercing             Do not wear make-up, lotions, powders, perfumes or deodorant  Do not wear nail polish including gel and S&S, artificial/acrylic nails, or any other type of covering on natural nails including finger and toenails. If you have artificial nails, gel coating, etc. that needs to be removed by a nail salon please have this removed prior to surgery or surgery may need to be canceled/ delayed if the surgeon/ anesthesia feels like they are unable to be safely monitored.   Do not shave  48 hours prior to surgery.             Do not bring valuables to the hospital. [place].   Contacts, dentures or bridgework may not  be worn into surgery.   Bring small overnight bag day of surgery.    Special Instructions: Bring a copy of your healthcare power of attorney and living will documents the day of surgery if you haven't scanned them in before.  Please read over the following fact sheets you were given: IF YOU HAVE QUESTIONS ABOUT YOUR PRE OP INSTRUCTIONS PLEASE CALL [place] - Preparing for Surgery Before surgery, you can play an important role.  Because skin is not sterile, your skin needs to be as free of germs as possible.  You can reduce the number of germs on your skin by washing with CHG (chlorahexidine gluconate) soap before surgery.  CHG is an antiseptic cleaner which kills germs and bonds with the skin to continue killing germs even after washing. Please DO NOT use if you have an allergy to CHG or antibacterial soaps.  If your skin becomes reddened/irritated stop using the CHG and inform your nurse when you arrive at Short Stay. Do not shave (including legs and underarms) for at least 48 hours prior to the first CHG shower.  You may shave your face/neck.  Please follow these instructions carefully:  1.  Shower with CHG Soap the night before surgery and the  morning of surgery.  2.  If you choose to wash your hair, wash your  hair first as usual with your normal  shampoo.  3.  After you shampoo, rinse your hair and body thoroughly to remove the shampoo.                             4.  Use CHG as you would any other liquid soap.  You can apply chg directly to the skin and wash.  Gently with a scrungie or clean washcloth.  5.  Apply the CHG Soap to your body ONLY FROM THE NECK DOWN.   Do   not use on face/ open                           Wound or open sores. Avoid contact with eyes, ears mouth and   genitals (private parts).                       Wash face,  Genitals (private parts) with your normal soap.             6.  Wash thoroughly, paying special attention to the area where your     surgery  will be performed.  7.  Thoroughly rinse your body with warm water from the neck down.  8.  DO NOT shower/wash with your normal soap after using and rinsing off the CHG Soap.                9.  Pat yourself dry with a clean towel.            10.  Wear clean pajamas.            11.  Place clean sheets on your bed the night of your first shower and do not  sleep with pets. Day of Surgery : Do not apply any lotions/deodorants the morning of surgery.  Please wear clean clothes to the hospital/surgery center.  FAILURE TO FOLLOW THESE INSTRUCTIONS MAY RESULT IN THE CANCELLATION OF YOUR SURGERY  PATIENT SIGNATURE_________________________________  NURSE SIGNATURE__________________________________  ________________________________________________________________________   Adam Phenix  An incentive spirometer is a tool that can help keep your lungs clear and active. This tool measures how well you are filling your lungs with each breath. Taking long deep breaths may help reverse or decrease the chance of developing breathing (pulmonary) problems (especially infection) following: A long period of time when you are unable to move or be active. BEFORE THE PROCEDURE  If the spirometer includes an indicator to show your best effort, your nurse or respiratory therapist will set it to a desired goal. If possible, sit up straight or lean slightly forward. Try not to slouch. Hold the incentive spirometer in an upright position. INSTRUCTIONS FOR USE  Sit on the edge of your bed if possible, or sit up as far as you can in bed or on a chair. Hold the incentive spirometer in an upright position. Breathe out normally. Place the mouthpiece in your mouth and seal your lips tightly around it. Breathe in slowly and as deeply as possible, raising the piston or the ball toward the top of the column. Hold your breath for 3-5 seconds or for as long as possible. Allow the piston or ball to fall to  the bottom of the column. Remove the mouthpiece from your mouth and breathe out normally. Rest for a few seconds and repeat Steps 1 through 7 at least 10 times every  1-2 hours when you are awake. Take your time and take a few normal breaths between deep breaths. The spirometer may include an indicator to show your best effort. Use the indicator as a goal to work toward during each repetition. After each set of 10 deep breaths, practice coughing to be sure your lungs are clear. If you have an incision (the cut made at the time of surgery), support your incision when coughing by placing a pillow or rolled up towels firmly against it. Once you are able to get out of bed, walk around indoors and cough well. You may stop using the incentive spirometer when instructed by your caregiver.  RISKS AND COMPLICATIONS Take your time so you do not get dizzy or light-headed. If you are in pain, you may need to take or ask for pain medication before doing incentive spirometry. It is harder to take a deep breath if you are having pain. AFTER USE Rest and breathe slowly and easily. It can be helpful to keep track of a log of your progress. Your caregiver can provide you with a simple table to help with this. If you are using the spirometer at home, follow these instructions: [place] IF:  You are having difficultly using the spirometer. You have trouble using the spirometer as often as instructed. Your pain medication is not giving enough relief while using the spirometer. You develop fever of 100.5 F (38.1 C) or higher. SEEK IMMEDIATE MEDICAL CARE IF:  You cough up bloody sputum that had not been present before. You develop fever of 102 F (38.9 C) or greater. You develop worsening pain at or near the incision site. MAKE SURE YOU:  Understand these instructions. Will watch your condition. Will get help right away if you are not doing well or get worse. Document Released: 02/18/2007 Document  Revised: 12/31/2011 Document Reviewed: 04/21/2007 ExitCare Patient Information 2014 ExitCare, [place].   ________________________________________________________________________  WHAT IS A BLOOD TRANSFUSION? Blood Transfusion Information  A transfusion is the replacement of blood or some of its parts. Blood is made up of multiple cells which provide different functions. Red blood cells carry oxygen and are used for blood loss replacement. White blood cells fight against infection. Platelets control bleeding. Plasma helps clot blood. Other blood products are available for specialized needs, such as hemophilia or other clotting disorders. BEFORE THE TRANSFUSION  Who gives blood for transfusions?  Healthy volunteers who are fully evaluated to make sure their blood is safe. This is blood bank blood. Transfusion therapy is the safest it has ever been in the practice of medicine. Before blood is taken from a donor, a complete history is taken to make sure that person has no history of diseases nor engages in risky social behavior (examples are intravenous drug use or sexual activity with multiple partners). The donor's travel history is screened to minimize risk of transmitting infections, such as malaria. The donated blood is tested for signs of infectious diseases, such as HIV and hepatitis. The blood is then tested to be sure it is compatible with you in order to minimize the chance of a transfusion reaction. If you or a relative donates blood, this is often done in anticipation of surgery and is not appropriate for emergency situations. It takes many days to process the donated blood. RISKS AND COMPLICATIONS Although transfusion therapy is very safe and saves many lives, the main dangers of transfusion include:  Getting an infectious disease. Developing a transfusion reaction. This is an allergic  reaction to something in the blood you were given. Every precaution is taken to prevent this. The  decision to have a blood transfusion has been considered carefully by your caregiver before blood is given. Blood is not given unless the benefits outweigh the risks. AFTER THE TRANSFUSION Right after receiving a blood transfusion, you will usually feel much better and more energetic. This is especially true if your red blood cells have gotten low (anemic). The transfusion raises the level of the red blood cells which carry oxygen, and this usually causes an energy increase. The nurse administering the transfusion will monitor you carefully for complications. HOME CARE INSTRUCTIONS  No special instructions are needed after a transfusion. You may find your energy is better. Speak with your caregiver about any limitations on activity for underlying diseases you may have. SEEK MEDICAL CARE IF:  Your condition is not improving after your transfusion. You develop redness or irritation at the intravenous (IV) site. SEEK IMMEDIATE MEDICAL CARE IF:  Any of the following symptoms occur over the next 12 hours: Shaking chills. You have a temperature by mouth above 102 F (38.9 C), not controlled by medicine. Chest, back, or muscle pain. People around you feel you are not acting correctly or are confused. Shortness of breath or difficulty breathing. Dizziness and fainting. You get a rash or develop hives. You have a decrease in urine output. Your urine turns a dark color or changes to pink, red, or brown. Any of the following symptoms occur over the next 10 days: You have a temperature by mouth above 102 F (38.9 C), not controlled by medicine. Shortness of breath. Weakness after normal activity. The white part of the eye turns yellow (jaundice). You have a decrease in the amount of urine or are urinating less often. Your urine turns a dark color or changes to pink, red, or brown. Document Released: 10/05/2000 Document Revised: 12/31/2011 Document Reviewed: 05/24/2008 [facility] Patient Information  2014 [place], [place].  _______________________________________________________________________

## 2021-08-24 NOTE — Progress Notes (Addendum)
Covid Swab Date:  09-08-21  COVID Vaccine Completed: x2 Date COVID Vaccine completed:  11-30-19 &  12-24-19 Booster:  Yes x3 06-09-20 COVID vaccine manufacturer: [employer] Past 90 days:  No   PCP - Milagros Evener, MD.  Office note on chart Cardiologist - Minus Breeding.  Last OV 06-14-20 in Epic, to f/u prn   Chest x-ray - N/A EKG - 08-30-21 Epic Stress Test - N/A ECHO - N/A Cardiac Cath - N/A   Sleep Study - N/A CPAP -    Fasting Blood Sugar - N/A Checks Blood Sugar _____ times a day   Blood Thinner Instructions:  N/A Aspirin Instructions: Last Dose:   Anesthesia review:  Eval by cardiology for abnormal EKG.  BP elevated at PAT appointment, 178/93 and on recheck 178/100.  Patient denies headache, chest pain, SOB.  Under significant stress as grandchild has RSV. She has cuff at home and will monitor and call PCP if it remains elevated.   Patient denies shortness of breath, fever, cough and chest pain at PAT appointment     Patient verbalized understanding of instructions that were given to them at the PAT appointment. Patient was also instructed that they will need to review over the PAT instructions again at home before surgery.

## 2021-08-30 ENCOUNTER — Encounter (HOSPITAL_COMMUNITY): Payer: Self-pay

## 2021-08-30 ENCOUNTER — Encounter (HOSPITAL_COMMUNITY)
Admission: RE | Admit: 2021-08-30 | Discharge: 2021-08-30 | Disposition: A | Discharge status: Home / Self Care | Payer: Medicare PPO | Source: Ambulatory Visit | Attending: Orthopedic Surgery | Admitting: Orthopedic Surgery

## 2021-08-30 ENCOUNTER — Other Ambulatory Visit: Payer: Self-pay

## 2021-08-30 DIAGNOSIS — Z01818 Encounter for other preprocedural examination: Secondary | ICD-10-CM | POA: Diagnosis not present

## 2021-08-30 LAB — COMPREHENSIVE METABOLIC PANEL
ALT: 23 U/L (ref 0–44)
AST: 23 U/L (ref 15–41)
Albumin: 4.5 g/dL (ref 3.5–5.0)
Alkaline Phosphatase: 28 U/L — ABNORMAL LOW (ref 38–126)
Anion gap: 6 (ref 5–15)
BUN: 26 mg/dL — ABNORMAL HIGH (ref 8–23)
CO2: 25 mmol/L (ref 22–32)
Calcium: 8.8 mg/dL — ABNORMAL LOW (ref 8.9–10.3)
Chloride: 101 mmol/L (ref 98–111)
Creatinine, Ser: 0.68 mg/dL (ref 0.44–1.00)
GFR, Estimated: >60 mL/min (ref >60)
Glucose, Bld: 93 mg/dL (ref 70–99)
Potassium: 4.2 mmol/L (ref 3.5–5.1)
Sodium: 132 mmol/L — ABNORMAL LOW (ref 135–145)
Total Bilirubin: 0.9 mg/dL (ref 0.3–1.2)
Total Protein: 7.1 g/dL (ref 6.5–8.1)

## 2021-08-30 LAB — CBC
HCT: 36.2 % (ref 36.0–46.0)
Hemoglobin: 12 g/dL (ref 12.0–15.0)
MCH: 32.6 pg (ref 26.0–34.0)
MCHC: 33.1 g/dL (ref 30.0–36.0)
MCV: 98.4 fL (ref 80.0–100.0)
Platelets: 237 10*3/uL (ref 150–400)
RBC: 3.68 MIL/uL — ABNORMAL LOW (ref 3.87–5.11)
RDW: 13.5 % (ref 11.5–15.5)
WBC: 3.5 10*3/uL — ABNORMAL LOW (ref 4.0–10.5)
nRBC: 0 % (ref 0.0–0.2)

## 2021-08-30 LAB — TYPE AND SCREEN
ABO/RH(D): A POS
Antibody Screen: NEGATIVE

## 2021-09-01 DIAGNOSIS — I10 Essential (primary) hypertension: Secondary | ICD-10-CM | POA: Diagnosis not present

## 2021-09-07 ENCOUNTER — Encounter (HOSPITAL_COMMUNITY): Payer: Self-pay

## 2021-09-08 ENCOUNTER — Other Ambulatory Visit: Payer: Self-pay | Admitting: Orthopedic Surgery

## 2021-09-08 LAB — SARS CORONAVIRUS 2 (TAT 6-24 HRS): SARS Coronavirus 2: NEGATIVE

## 2021-09-11 NOTE — Progress Notes (Signed)
Called pt about time change for surgery on 09/12/21. Pt to arrive 1015 for 1255 surgery. To complete ERAS drink by 0955. She verbalizes understanding.

## 2021-09-11 NOTE — H&P (Signed)
TOTAL HIP ADMISSION H&P  Patient is admitted for right total hip arthroplasty.  Subjective:  Chief Complaint: right hip pain  HPI: Jacqueline Tucker, 70 y.o. female, has a history of pain and functional disability in the right hip(s) due to arthritis and patient has failed non-surgical conservative treatments for greater than 12 weeks to include NSAID's and/or analgesics and activity modification.  Onset of symptoms was gradual starting 2 years ago with gradually worsening course since that time.The patient noted no past surgery on the right hip(s).  Patient currently rates pain in the right hip at 8 out of 10 with activity. Patient has worsening of pain with activity and weight bearing, pain that interfers with activities of daily living, and pain with passive range of motion. Patient has evidence of joint space narrowing by imaging studies. This condition presents safety issues increasing the risk of falls. There is no current active infection.  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   S/P total hip arthroplasty 06/28/2020   Educated about COVID-19 virus infection 06/13/2020   Nonspecific abnormal electrocardiogram (ECG) (EKG) 06/13/2020   Past Medical History:  Diagnosis Date   Abnormal Pap smear of cervix 10/99   LGSIL   Arthritis    Basal cell carcinoma (BCC)    On hand   Complication of anesthesia    Headache   GERD (gastroesophageal reflux disease)    Headache(784.0)    migraines   Hypertension    Osteoporosis    Pneumonia    PONV (postoperative nausea and vomiting)    Rheumatoid arthritis ([place])     Past Surgical History:  Procedure Laterality Date   CRYOTHERAPY  1984   dysplasia   DECOMPRESSIVE LUMBAR LAMINECTOMY LEVEL 2 N/A 11/11/2013   Procedure: DECOMPRESSIVE LUMBAR LAMINECTOMY L3-L5;  Surgeon: Melina Schools, MD;  Location: [facility];  Service: Orthopedics;  Laterality: N/A;   DILATION AND CURETTAGE OF  UTERUS  1990   SAB   LUMBAR LAMINECTOMY  11/11/2013   L3   L5     DR BROOKS   SKIN CANCER EXCISION     Hand   TOTAL HIP ARTHROPLASTY Left 06/28/2020   Procedure: TOTAL HIP ARTHROPLASTY ANTERIOR APPROACH;  Surgeon: Paralee Cancel, MD;  Location: [facility];  Service: Orthopedics;  Laterality: Left;  70 mins   [place]    No current facility-administered medications for this encounter.   Current Outpatient Medications  Medication Sig Dispense Refill Last Dose   acetaminophen (TYLENOL) 500 MG tablet Take 1,000 mg by mouth every 6 (six) hours as needed for moderate pain.      Biotin 1000 MCG tablet Take 1,000 mcg by mouth daily.      Calcium Carb-Cholecalciferol (CALCIUM 600 + D PO) Take 1 tablet by mouth every other day.      Carboxymethylcellul-Glycerin (LUBRICATING EYE DROPS OP) Place 1 drop into both eyes daily as needed (dry eyes).      celecoxib (CELEBREX) 200 MG capsule Take 200 mg by mouth 2 (two) times daily.       Cholecalciferol (VITAMIN D) 50 MCG (2000 UT) CAPS Take 2,000 Units by mouth daily.      denosumab (PROLIA) 60 MG/ML SOLN injection Inject 60 mg into the skin every 6 (six) months. Administer in upper arm, thigh, or abdomen      folic acid (FOLVITE) 1 MG tablet Take 2 mg by mouth daily.  gabapentin (NEURONTIN) 100 MG capsule Take 100-200 mg by mouth See admin instructions. Take 100 mg in the morning and 200 mg at night      lisinopril (ZESTRIL) 20 MG tablet Take 40 mg by mouth daily.      methotrexate (RHEUMATREX) 2.5 MG tablet Take 20 mg by mouth every Sunday. Caution:Chemotherapy. Protect from light.      Multiple Vitamin (MULTIVITAMIN WITH MINERALS) TABS tablet Take 1 tablet by mouth every other day.       Omega-3 Fatty Acids (FISH OIL) 1000 MG CAPS Take 1,000 mg by mouth daily.      RINVOQ 15 MG TB24 Take 15 mg by mouth daily.       Allergies  Allergen Reactions   Codeine     fainting   Latex Itching   Neosporin [Neomycin-Bacitracin Zn-Polymyx]  Rash    Social History   Tobacco Use   Smoking status: Never   Smokeless tobacco: Never  Substance Use Topics   Alcohol use: No    Alcohol/week: 0.0 standard drinks    Family History  Problem Relation Age of Onset   Breast cancer Mother 5   Hypertension Mother    Thyroid disease Mother    Cancer Mother        ocular melanoma   Cancer Father        prostate   Hypertension Father    Thyroid disease Father    Heart attack Maternal Grandmother    Cancer Maternal Grandfather        blood   Cancer Paternal Grandmother        stomach     Review of Systems  Constitutional:  Negative for chills and fever.  Respiratory:  Negative for cough and shortness of breath.   Cardiovascular:  Negative for chest pain.  Gastrointestinal:  Negative for nausea and vomiting.  Musculoskeletal:  Positive for arthralgias.    Objective:  Physical Exam Well nourished and well developed. General: Alert and oriented x3, cooperative and pleasant, no acute distress. Head: normocephalic, atraumatic, neck supple. Eyes: EOMI.  Musculoskeletal: Right hip exam: Pain and limited hip flexion internal rotation to 5 degrees with pelvic tilting and external rotation to 20 degrees. Weakness due to discomfort with active hip flexion with slight external rotation contracture noted Neurovascularly intact bilaterally  Calves soft and nontender. Motor function intact in LE. Strength 5/5 LE bilaterally. Neuro: Distal pulses 2+. Sensation to light touch intact in LE.  Vital signs in last 24 hours:    Labs:   Estimated body mass index is 25.53 kg/m as calculated from the following:   Height as of 08/30/21: 5' 4.5" (1.638 m).   Weight as of 08/30/21: 68.5 kg.   Imaging Review Plain radiographs demonstrate severe degenerative joint disease of the right hip(s). The bone quality appears to be adequate for age and reported activity level.      Assessment/Plan:  End stage arthritis, right  hip(s)  The patient history, physical examination, clinical judgement of the provider and imaging studies are consistent with end stage degenerative joint disease of the right hip(s) and total hip arthroplasty is deemed medically necessary. The treatment options including medical management, injection therapy, arthroscopy and arthroplasty were discussed at length. The risks and benefits of total hip arthroplasty were presented and reviewed. The risks due to aseptic loosening, infection, stiffness, dislocation/subluxation,  thromboembolic complications and other imponderables were discussed.  The patient acknowledged the explanation, agreed to proceed with the plan and consent was signed. Patient is  being admitted for inpatient treatment for surgery, pain control, PT, OT, prophylactic antibiotics, VTE prophylaxis, progressive ambulation and ADL's and discharge planning.The patient is planning to be discharged  home.  Therapy Plans: HEP Disposition: Home with husband Planned DVT Prophylaxis: aspirin 81mg  BID DME needed: none PCP: Dr. Milagros Evener, clearance received Cardiologist: Dr. Percival Spanish, clearance received Rheumatologist: Dr. Gavin Pound (Stop rinvoq 1 week before & 1 week after) TXA: IV Allergies: codeine - passed out, latex - itching, neosporin - rash Anesthesia Concerns: vomiting, headache (general), PATIENT WILL BE GENERAL (Dr. Rolena Infante recommends against spinal, says likely too much scar tissue there) BMI: 25.8 Last HgbA1c: Not diabetic  Other: - Tramadol (20), tylenol, robaxin, celecoxib (has at home) -- toradol - L4-5 laminectomy with Dr. Rolena Infante - has had bad left leg radiculopathy recently - Has RA   Costella Hatcher, PA-C Orthopedic Surgery [facility] [place] [phone]

## 2021-09-12 ENCOUNTER — Ambulatory Visit (HOSPITAL_COMMUNITY): Payer: Medicare PPO | Admitting: Physician Assistant

## 2021-09-12 ENCOUNTER — Ambulatory Visit (HOSPITAL_COMMUNITY): Payer: Medicare PPO

## 2021-09-12 ENCOUNTER — Encounter (HOSPITAL_COMMUNITY)
Admission: RE | Disposition: A | Discharge status: Home / Self Care | Payer: Self-pay | Source: Ambulatory Visit | Attending: Orthopedic Surgery

## 2021-09-12 ENCOUNTER — Other Ambulatory Visit: Payer: Self-pay

## 2021-09-12 ENCOUNTER — Observation Stay (HOSPITAL_COMMUNITY): Payer: Medicare PPO

## 2021-09-12 ENCOUNTER — Ambulatory Visit (HOSPITAL_COMMUNITY): Payer: Medicare PPO | Admitting: Certified Registered Nurse Anesthetist

## 2021-09-12 ENCOUNTER — Encounter (HOSPITAL_COMMUNITY): Payer: Self-pay | Admitting: Orthopedic Surgery

## 2021-09-12 ENCOUNTER — Observation Stay (HOSPITAL_COMMUNITY)
Admission: RE | Admit: 2021-09-12 | Discharge: 2021-09-13 | Disposition: A | Discharge status: Home / Self Care | Payer: Medicare PPO | Source: Ambulatory Visit | Attending: Orthopedic Surgery | Admitting: Orthopedic Surgery

## 2021-09-12 DIAGNOSIS — Z85828 Personal history of other malignant neoplasm of skin: Secondary | ICD-10-CM | POA: Insufficient documentation

## 2021-09-12 DIAGNOSIS — M1711 Unilateral primary osteoarthritis, right knee: Secondary | ICD-10-CM | POA: Insufficient documentation

## 2021-09-12 DIAGNOSIS — Z471 Aftercare following joint replacement surgery: Secondary | ICD-10-CM | POA: Diagnosis not present

## 2021-09-12 DIAGNOSIS — Z96641 Presence of right artificial hip joint: Secondary | ICD-10-CM | POA: Diagnosis not present

## 2021-09-12 DIAGNOSIS — Z96642 Presence of left artificial hip joint: Secondary | ICD-10-CM | POA: Diagnosis not present

## 2021-09-12 DIAGNOSIS — I10 Essential (primary) hypertension: Secondary | ICD-10-CM | POA: Insufficient documentation

## 2021-09-12 DIAGNOSIS — Z91040 Latex allergy status: Secondary | ICD-10-CM | POA: Insufficient documentation

## 2021-09-12 DIAGNOSIS — K219 Gastro-esophageal reflux disease without esophagitis: Secondary | ICD-10-CM | POA: Diagnosis not present

## 2021-09-12 DIAGNOSIS — Z79899 Other long term (current) drug therapy: Secondary | ICD-10-CM | POA: Insufficient documentation

## 2021-09-12 DIAGNOSIS — M1611 Unilateral primary osteoarthritis, right hip: Secondary | ICD-10-CM | POA: Diagnosis not present

## 2021-09-12 DIAGNOSIS — J189 Pneumonia, unspecified organism: Secondary | ICD-10-CM | POA: Diagnosis not present

## 2021-09-12 DIAGNOSIS — Z96649 Presence of unspecified artificial hip joint: Secondary | ICD-10-CM

## 2021-09-12 HISTORY — PX: TOTAL HIP ARTHROPLASTY: SHX124

## 2021-09-12 SURGERY — ARTHROPLASTY, HIP, TOTAL, ANTERIOR APPROACH
Anesthesia: Spinal | Site: Hip | Laterality: Right

## 2021-09-12 MED ORDER — METHOCARBAMOL 500 MG IVPB - SIMPLE MED
500.0000 mg | Freq: Four times a day (QID) | INTRAVENOUS | Status: DC | PRN
  Filled 2021-09-12: qty 50

## 2021-09-12 MED ORDER — GLYCOPYRROLATE 0.2 MG/ML IJ SOLN
INTRAMUSCULAR | Status: AC
  Filled 2021-09-12: qty 4

## 2021-09-12 MED ORDER — DOCUSATE SODIUM 100 MG PO CAPS
100.0000 mg | ORAL_CAPSULE | Freq: Two times a day (BID) | ORAL | Status: DC
  Administered 2021-09-12 – 2021-09-13 (×2): 100 mg via ORAL
  Filled 2021-09-12 (×2): qty 1

## 2021-09-12 MED ORDER — LACTATED RINGERS IV SOLN: INTRAVENOUS | Status: DC

## 2021-09-12 MED ORDER — CEFAZOLIN SODIUM-DEXTROSE 2-4 GM/100ML-% IV SOLN
2.0000 g | INTRAVENOUS | Status: AC
  Administered 2021-09-12: 2 g via INTRAVENOUS
  Filled 2021-09-12: qty 100

## 2021-09-12 MED ORDER — MIDAZOLAM HCL 5 MG/5ML IJ SOLN
INTRAMUSCULAR | Status: DC | PRN
  Administered 2021-09-12: 2 mg via INTRAVENOUS

## 2021-09-12 MED ORDER — FOLIC ACID 1 MG PO TABS
2.0000 mg | ORAL_TABLET | Freq: Every day | ORAL | Status: DC
  Administered 2021-09-12 – 2021-09-13 (×2): 2 mg via ORAL
  Filled 2021-09-12 (×2): qty 2

## 2021-09-12 MED ORDER — PHENYLEPHRINE 40 MCG/ML (10ML) SYRINGE FOR IV PUSH (FOR BLOOD PRESSURE SUPPORT)
PREFILLED_SYRINGE | INTRAVENOUS | Status: AC
  Filled 2021-09-12: qty 10

## 2021-09-12 MED ORDER — SODIUM CHLORIDE 0.9 % IR SOLN
Status: DC | PRN
  Administered 2021-09-12: 1000 mL

## 2021-09-12 MED ORDER — PROPOFOL 500 MG/50ML IV EMUL
INTRAVENOUS | Status: DC | PRN
  Administered 2021-09-12: 75 ug/kg/min via INTRAVENOUS

## 2021-09-12 MED ORDER — TRANEXAMIC ACID-NACL 1000-0.7 MG/100ML-% IV SOLN
1000.0000 mg | INTRAVENOUS | Status: AC
  Administered 2021-09-12: 1000 mg via INTRAVENOUS
  Filled 2021-09-12: qty 100

## 2021-09-12 MED ORDER — ACETAMINOPHEN 10 MG/ML IV SOLN: 1000.0000 mg | Freq: Once | INTRAVENOUS | Status: DC | PRN

## 2021-09-12 MED ORDER — KETOROLAC TROMETHAMINE 15 MG/ML IJ SOLN
7.5000 mg | Freq: Four times a day (QID) | INTRAMUSCULAR | Status: DC
  Administered 2021-09-12 – 2021-09-13 (×3): 7.5 mg via INTRAVENOUS
  Filled 2021-09-12 (×3): qty 1

## 2021-09-12 MED ORDER — HYDROMORPHONE HCL 1 MG/ML IJ SOLN
0.5000 mg | INTRAMUSCULAR | Status: DC | PRN
  Administered 2021-09-12: 0.5 mg via INTRAVENOUS
  Administered 2021-09-13: 1 mg via INTRAVENOUS
  Filled 2021-09-12 (×2): qty 1

## 2021-09-12 MED ORDER — MEPERIDINE HCL 50 MG/ML IJ SOLN: 6.2500 mg | INTRAMUSCULAR | Status: DC | PRN

## 2021-09-12 MED ORDER — MIDAZOLAM HCL 2 MG/2ML IJ SOLN
INTRAMUSCULAR | Status: AC
  Filled 2021-09-12: qty 2

## 2021-09-12 MED ORDER — PHENYLEPHRINE HCL-NACL 20-0.9 MG/250ML-% IV SOLN
INTRAVENOUS | Status: DC | PRN
  Administered 2021-09-12: 45 ug/min via INTRAVENOUS

## 2021-09-12 MED ORDER — ORAL CARE MOUTH RINSE: 15.0000 mL | Freq: Once | OROMUCOSAL | Status: AC

## 2021-09-12 MED ORDER — CHLORHEXIDINE GLUCONATE 0.12 % MT SOLN
15.0000 mL | Freq: Once | OROMUCOSAL | Status: AC
  Administered 2021-09-12: 15 mL via OROMUCOSAL

## 2021-09-12 MED ORDER — PHENOL 1.4 % MT LIQD: 1.0000 | OROMUCOSAL | Status: DC | PRN

## 2021-09-12 MED ORDER — DEXAMETHASONE SODIUM PHOSPHATE 10 MG/ML IJ SOLN
INTRAMUSCULAR | Status: DC | PRN
  Administered 2021-09-12: 8 mg via INTRAVENOUS

## 2021-09-12 MED ORDER — DEXAMETHASONE SODIUM PHOSPHATE 10 MG/ML IJ SOLN
10.0000 mg | Freq: Once | INTRAMUSCULAR | Status: AC
  Administered 2021-09-13: 10 mg via INTRAVENOUS
  Filled 2021-09-12: qty 1

## 2021-09-12 MED ORDER — GABAPENTIN 100 MG PO CAPS
200.0000 mg | ORAL_CAPSULE | Freq: Every day | ORAL | Status: DC
  Administered 2021-09-12: 200 mg via ORAL
  Filled 2021-09-12: qty 2

## 2021-09-12 MED ORDER — DIPHENHYDRAMINE HCL 12.5 MG/5ML PO ELIX: 12.5000 mg | ORAL_SOLUTION | ORAL | Status: DC | PRN

## 2021-09-12 MED ORDER — POLYETHYLENE GLYCOL 3350 17 G PO PACK: 17.0000 g | PACK | Freq: Every day | ORAL | Status: DC | PRN

## 2021-09-12 MED ORDER — METOCLOPRAMIDE HCL 5 MG/ML IJ SOLN: 5.0000 mg | Freq: Three times a day (TID) | INTRAMUSCULAR | Status: DC | PRN

## 2021-09-12 MED ORDER — EPHEDRINE SULFATE-NACL 50-0.9 MG/10ML-% IV SOSY
PREFILLED_SYRINGE | INTRAVENOUS | Status: DC | PRN
  Administered 2021-09-12: 10 mg via INTRAVENOUS

## 2021-09-12 MED ORDER — KETOROLAC TROMETHAMINE 15 MG/ML IJ SOLN: 15.0000 mg | Freq: Once | INTRAMUSCULAR | Status: DC

## 2021-09-12 MED ORDER — MENTHOL 3 MG MT LOZG: 1.0000 | LOZENGE | OROMUCOSAL | Status: DC | PRN

## 2021-09-12 MED ORDER — FENTANYL CITRATE (PF) 100 MCG/2ML IJ SOLN
INTRAMUSCULAR | Status: AC
  Filled 2021-09-12: qty 2

## 2021-09-12 MED ORDER — ASPIRIN 81 MG PO CHEW
81.0000 mg | CHEWABLE_TABLET | Freq: Two times a day (BID) | ORAL | Status: DC
  Administered 2021-09-12 – 2021-09-13 (×2): 81 mg via ORAL
  Filled 2021-09-12 (×2): qty 1

## 2021-09-12 MED ORDER — SODIUM CHLORIDE 0.9 % IV SOLN: INTRAVENOUS | Status: DC

## 2021-09-12 MED ORDER — FENTANYL CITRATE (PF) 100 MCG/2ML IJ SOLN
INTRAMUSCULAR | Status: DC | PRN
  Administered 2021-09-12: 50 ug via INTRAVENOUS

## 2021-09-12 MED ORDER — POVIDONE-IODINE 10 % EX SWAB
2.0000 "application " | Freq: Once | CUTANEOUS | Status: AC
  Administered 2021-09-12: 2 via TOPICAL

## 2021-09-12 MED ORDER — PHENYLEPHRINE 40 MCG/ML (10ML) SYRINGE FOR IV PUSH (FOR BLOOD PRESSURE SUPPORT)
PREFILLED_SYRINGE | INTRAVENOUS | Status: DC | PRN
  Administered 2021-09-12 (×4): 80 ug via INTRAVENOUS

## 2021-09-12 MED ORDER — METHOCARBAMOL 500 MG PO TABS
500.0000 mg | ORAL_TABLET | Freq: Four times a day (QID) | ORAL | Status: DC | PRN
  Administered 2021-09-12 – 2021-09-13 (×2): 500 mg via ORAL
  Filled 2021-09-12 (×2): qty 1

## 2021-09-12 MED ORDER — STERILE WATER FOR IRRIGATION IR SOLN
Status: DC | PRN
  Administered 2021-09-12: 2000 mL

## 2021-09-12 MED ORDER — TRANEXAMIC ACID-NACL 1000-0.7 MG/100ML-% IV SOLN
1000.0000 mg | Freq: Once | INTRAVENOUS | Status: AC
  Administered 2021-09-12: 1000 mg via INTRAVENOUS
  Filled 2021-09-12: qty 100

## 2021-09-12 MED ORDER — BISACODYL 10 MG RE SUPP: 10.0000 mg | Freq: Every day | RECTAL | Status: DC | PRN

## 2021-09-12 MED ORDER — ONDANSETRON HCL 4 MG/2ML IJ SOLN
INTRAMUSCULAR | Status: DC | PRN
  Administered 2021-09-12: 4 mg via INTRAVENOUS

## 2021-09-12 MED ORDER — POLYVINYL ALCOHOL 1.4 % OP SOLN
1.0000 [drp] | OPHTHALMIC | Status: DC | PRN
  Filled 2021-09-12: qty 15

## 2021-09-12 MED ORDER — ACETAMINOPHEN 325 MG PO TABS: 325.0000 mg | ORAL_TABLET | Freq: Four times a day (QID) | ORAL | Status: DC | PRN

## 2021-09-12 MED ORDER — METOCLOPRAMIDE HCL 5 MG PO TABS: 5.0000 mg | ORAL_TABLET | Freq: Three times a day (TID) | ORAL | Status: DC | PRN

## 2021-09-12 MED ORDER — ACETAMINOPHEN 500 MG PO TABS
500.0000 mg | ORAL_TABLET | Freq: Four times a day (QID) | ORAL | Status: AC
  Administered 2021-09-12 – 2021-09-13 (×4): 500 mg via ORAL
  Filled 2021-09-12 (×4): qty 1

## 2021-09-12 MED ORDER — GABAPENTIN 100 MG PO CAPS
100.0000 mg | ORAL_CAPSULE | Freq: Every day | ORAL | Status: DC
  Administered 2021-09-13: 100 mg via ORAL
  Filled 2021-09-12: qty 1

## 2021-09-12 MED ORDER — TRAMADOL HCL 50 MG PO TABS
50.0000 mg | ORAL_TABLET | Freq: Four times a day (QID) | ORAL | Status: DC | PRN
  Administered 2021-09-12 – 2021-09-13 (×2): 100 mg via ORAL
  Filled 2021-09-12 (×2): qty 2

## 2021-09-12 MED ORDER — DEXAMETHASONE SODIUM PHOSPHATE 10 MG/ML IJ SOLN: 8.0000 mg | Freq: Once | INTRAMUSCULAR | Status: DC

## 2021-09-12 MED ORDER — BUPIVACAINE IN DEXTROSE 0.75-8.25 % IT SOLN
INTRATHECAL | Status: DC | PRN
  Administered 2021-09-12: 1.4 mL via INTRATHECAL

## 2021-09-12 MED ORDER — FERROUS SULFATE 325 (65 FE) MG PO TABS
325.0000 mg | ORAL_TABLET | Freq: Three times a day (TID) | ORAL | Status: DC
  Administered 2021-09-12 – 2021-09-13 (×3): 325 mg via ORAL
  Filled 2021-09-12 (×3): qty 1

## 2021-09-12 MED ORDER — CEFAZOLIN SODIUM-DEXTROSE 2-4 GM/100ML-% IV SOLN
2.0000 g | Freq: Four times a day (QID) | INTRAVENOUS | Status: AC
  Administered 2021-09-12 – 2021-09-13 (×2): 2 g via INTRAVENOUS
  Filled 2021-09-12 (×2): qty 100

## 2021-09-12 MED ORDER — PROPOFOL 10 MG/ML IV BOLUS
INTRAVENOUS | Status: DC | PRN
  Administered 2021-09-12: 10 mg via INTRAVENOUS

## 2021-09-12 MED ORDER — CELECOXIB 200 MG PO CAPS
200.0000 mg | ORAL_CAPSULE | Freq: Two times a day (BID) | ORAL | Status: DC
  Administered 2021-09-12 – 2021-09-13 (×2): 200 mg via ORAL
  Filled 2021-09-12 (×2): qty 1

## 2021-09-12 MED ORDER — ONDANSETRON HCL 4 MG PO TABS: 4.0000 mg | ORAL_TABLET | Freq: Four times a day (QID) | ORAL | Status: DC | PRN

## 2021-09-12 MED ORDER — LISINOPRIL 20 MG PO TABS
40.0000 mg | ORAL_TABLET | Freq: Every day | ORAL | Status: DC
  Filled 2021-09-12: qty 2

## 2021-09-12 MED ORDER — FENTANYL CITRATE PF 50 MCG/ML IJ SOSY: 25.0000 ug | PREFILLED_SYRINGE | INTRAMUSCULAR | Status: DC | PRN

## 2021-09-12 MED ORDER — ONDANSETRON HCL 4 MG/2ML IJ SOLN: 4.0000 mg | Freq: Four times a day (QID) | INTRAMUSCULAR | Status: DC | PRN

## 2021-09-12 MED ORDER — ONDANSETRON HCL 4 MG/2ML IJ SOLN: 4.0000 mg | Freq: Once | INTRAMUSCULAR | Status: DC | PRN

## 2021-09-12 SURGICAL SUPPLY — 41 items
ADH SKN CLS APL DERMABOND .7 (GAUZE/BANDAGES/DRESSINGS) ×1
BAG COUNTER SPONGE SURGICOUNT (BAG) IMPLANT
BAG DECANTER FOR FLEXI CONT (MISCELLANEOUS) IMPLANT
BAG SPEC THK2 15X12 ZIP CLS (MISCELLANEOUS)
BAG SPNG CNTER NS LX DISP (BAG)
BAG ZIPLOCK 12X15 (MISCELLANEOUS) IMPLANT
BLADE SAG 18X100X1.27 (BLADE) ×2 IMPLANT
COVER PERINEAL POST (MISCELLANEOUS) ×2 IMPLANT
COVER SURGICAL LIGHT HANDLE (MISCELLANEOUS) ×2 IMPLANT
CUP ACETBLR 52 OD PINNACLE (Hips) ×1 IMPLANT
DERMABOND ADVANCED (GAUZE/BANDAGES/DRESSINGS) ×1
DERMABOND ADVANCED .7 DNX12 (GAUZE/BANDAGES/DRESSINGS) ×1 IMPLANT
DRAPE FOOT SWITCH (DRAPES) ×2 IMPLANT
DRAPE STERI IOBAN 125X83 (DRAPES) ×2 IMPLANT
DRAPE U-SHAPE 47X51 STRL (DRAPES) ×4 IMPLANT
DRESSING AQUACEL AG SP 3.5X10 (GAUZE/BANDAGES/DRESSINGS) ×1 IMPLANT
DRSG AQUACEL AG SP 3.5X10 (GAUZE/BANDAGES/DRESSINGS) ×2
DURAPREP 26ML APPLICATOR (WOUND CARE) ×2 IMPLANT
ELECT REM PT RETURN 15FT ADLT (MISCELLANEOUS) ×2 IMPLANT
ELIMINATOR HOLE APEX DEPUY (Hips) ×1 IMPLANT
GLOVE SURG ENC MOIS LTX SZ7 (GLOVE) ×2 IMPLANT
GLOVE SURG UNDER LTX SZ6.5 (GLOVE) ×2 IMPLANT
GLOVE SURG UNDER POLY LF SZ7.5 (GLOVE) ×2 IMPLANT
GOWN STRL REUS W/TWL LRG LVL3 (GOWN DISPOSABLE) ×4 IMPLANT
HEAD CERAMIC DELTA 36 PLUS 1.5 (Hips) ×1 IMPLANT
HOLDER FOLEY CATH W/STRAP (MISCELLANEOUS) ×2 IMPLANT
KIT TURNOVER KIT A (KITS) IMPLANT
LINER NEUTRAL 52X36MM PLUS 4 (Liner) ×1 IMPLANT
PACK ANTERIOR HIP CUSTOM (KITS) ×2 IMPLANT
PENCIL SMOKE EVACUATOR (MISCELLANEOUS) IMPLANT
SCREW 6.5MMX30MM (Screw) ×1 IMPLANT
STEM FEM ACTIS HIGH SZ7 (Stem) ×1 IMPLANT
SUT MNCRL AB 4-0 PS2 18 (SUTURE) ×2 IMPLANT
SUT STRATAFIX 0 PDS 27 VIOLET (SUTURE) ×2
SUT VIC AB 1 CT1 36 (SUTURE) ×6 IMPLANT
SUT VIC AB 2-0 CT1 27 (SUTURE) ×4
SUT VIC AB 2-0 CT1 TAPERPNT 27 (SUTURE) ×2 IMPLANT
SUTURE STRATFX 0 PDS 27 VIOLET (SUTURE) ×1 IMPLANT
TRAY FOLEY MTR SLVR 16FR STAT (SET/KITS/TRAYS/PACK) IMPLANT
TUBE SUCTION HIGH CAP CLEAR NV (SUCTIONS) ×2 IMPLANT
WATER STERILE IRR 1000ML POUR (IV SOLUTION) ×2 IMPLANT

## 2021-09-12 NOTE — Interval H&P Note (Signed)
History and Physical Interval Note:  09/12/2021 11:15 AM  Jacqueline Tucker  has presented today for surgery, with the diagnosis of Right hip osteoarthritis.  The various methods of treatment have been discussed with the patient and family. After consideration of risks, benefits and other options for treatment, the patient has consented to  Procedure(s): TOTAL HIP ARTHROPLASTY ANTERIOR APPROACH (Right) as a surgical intervention.  The patient's history has been reviewed, patient examined, no change in status, stable for surgery.  I have reviewed the patient's chart and labs.  Questions were answered to the patient's satisfaction.     Mauri Pole

## 2021-09-12 NOTE — Care Plan (Signed)
Ortho Bundle Case Management Note  Patient Details  Name: Jacqueline Tucker MRN: [id number] Date of Birth: 01-11-1951  R THA on 09-12-21 DCP:  Home with husband.   DME:  No needs PT:  HEP                   DME Arranged:  N/A DME Agency:  NA  HH Arranged:  NA HH Agency:  NA  Additional Comments: Please contact me with any questions of if this plan should need to change.  Marianne Sofia, RN,CCM [facility]  [phone] 09/12/2021, 2:12 PM

## 2021-09-12 NOTE — Anesthesia Procedure Notes (Signed)
Spinal  Patient location during procedure: OR Start time: 09/12/2021 12:45 PM End time: 09/12/2021 12:48 PM Reason for block: surgical anesthesia Staffing Performed: anesthesiologist  Anesthesiologist: Lyn Hollingshead, MD Preanesthetic Checklist Completed: patient identified, IV checked, site marked, risks and benefits discussed, surgical consent, monitors and equipment checked, pre-op evaluation and timeout performed Spinal Block Patient position: sitting Prep: DuraPrep and site prepped and draped Patient monitoring: continuous pulse ox and blood pressure Approach: midline Location: L3-4 Injection technique: single-shot Needle Needle type: Pencan  Needle gauge: 24 G Needle length: 10 cm Needle insertion depth: 6 cm Assessment Sensory level: T8 Events: CSF return

## 2021-09-12 NOTE — Op Note (Signed)
NAME:  Jacqueline Tucker                ACCOUNT NO.: [id number]      MEDICAL RECORD NO.: [id number]      FACILITY:  [facility]      PHYSICIAN:  Mauri Pole  DATE OF BIRTH:  29-Oct-1950     DATE OF PROCEDURE:  09/12/2021                                 OPERATIVE REPORT         PREOPERATIVE DIAGNOSIS: Right  hip osteoarthritis.      POSTOPERATIVE DIAGNOSIS:  Right hip osteoarthritis.      PROCEDURE:  Right total hip replacement through an anterior approach   utilizing DePuy THR system, component size 52 mm pinnacle cup, a size 36+4 neutral   Altrex liner, a size 7 Hi Actis stem with a 36+1.5 delta ceramic   ball.      SURGEON:  Pietro Cassis. Alvan Dame, M.D.      ASSISTANT:  Costella Hatcher, PA-C     ANESTHESIA:  Spinal.      SPECIMENS:  None.      COMPLICATIONS:  None.      BLOOD LOSS:  900 cc     DRAINS:  None.      INDICATION OF THE PROCEDURE:  Jacqueline Tucker is a 70 y.o. female who had   presented to office for evaluation of right hip pain.  Radiographs revealed   progressive degenerative changes with bone-on-bone   articulation of the  hip joint, including subchondral cystic changes and osteophytes.  The patient had painful limited range of   motion significantly affecting their overall quality of life and function.  The patient was failing to    respond to conservative measures including medications and/or injections and activity modification and at this point was ready   to proceed with more definitive measures.  Consent was obtained for   benefit of pain relief.  Specific risks of infection, DVT, component   failure, dislocation, neurovascular injury, and need for revision surgery were reviewed in the office as well discussion of   the anterior versus posterior approach were reviewed.     PROCEDURE IN DETAIL:  The patient was brought to operative theater.   Once adequate anesthesia, preoperative antibiotics, 2 gm of Ancef, 1 gm of Tranexamic Acid, and 10  mg of Decadron were administered, the patient was positioned supine on the [employer] table.  Once the patient was safely positioned with adequate padding of boney prominences we predraped out the hip, and used fluoroscopy to confirm orientation of the pelvis.      The right hip was then prepped and draped from proximal iliac crest to   mid thigh with a shower curtain technique.      Time-out was performed identifying the patient, planned procedure, and the appropriate extremity.     An incision was then made 2 cm lateral to the   anterior superior iliac spine extending over the orientation of the   tensor fascia lata muscle and sharp dissection was carried down to the   fascia of the muscle.      The fascia was then incised.  The muscle belly was identified and swept   laterally and retractor placed along the superior neck.  Following   cauterization of the circumflex vessels and removing some  pericapsular   fat, a second cobra retractor was placed on the inferior neck.  A T-capsulotomy was made along the line of the   superior neck to the trochanteric fossa, then extended proximally and   distally.  Tag sutures were placed and the retractors were then placed   intracapsular.  We then identified the trochanteric fossa and   orientation of my neck cut and then made a neck osteotomy with the femur on traction.  The femoral   head was removed without difficulty or complication.  Traction was let   off and retractors were placed posterior and anterior around the   acetabulum.      The labrum and foveal tissue were debrided.  I began reaming with a 45 mm   reamer and reamed up to 51 mm reamer with good bony bed preparation and a 52 mm  cup was chosen.  The final 52 mm Pinnacle cup was then impacted under fluoroscopy to confirm the depth of penetration and orientation with respect to   Abduction and forward flexion.  A screw was placed into the ilium followed by the hole eliminator.  The final    36+4 neutral Altrex liner was impacted with good visualized rim fit.  The cup was positioned anatomically within the acetabular portion of the pelvis.      At this point, the femur was rolled to 100 degrees.  Further capsule was   released off the inferior aspect of the femoral neck.  I then   released the superior capsule proximally.  With the leg in a neutral position the hook was placed laterally   along the femur under the vastus lateralis origin and elevated manually and then held in position using the hook attachment on the bed.  The leg was then extended and adducted with the leg rolled to 100   degrees of external rotation.  Retractors were placed along the medial calcar and posteriorly over the greater trochanter.  Once the proximal femur was fully   exposed, I used a box osteotome to set orientation.  I then began   broaching with the starting chili pepper broach and passed this by hand and then broached up to 7.  With the 7 broach in place I chose a high offset neck and did several trial reductions.  The offset was appropriate, leg lengths   appeared to be equal best matched with the =1.5 head ball trial confirmed radiographically.   Given these findings, I went ahead and dislocated the hip, repositioned all   retractors and positioned the right hip in the extended and abducted position.  The final 7 Hi Actis stem was   chosen and it was impacted down to the level of neck cut.  Based on this   and the trial reductions, a final 36+1.5 delta ceramic ball was chosen and   impacted onto a clean and dry trunnion, and the hip was reduced.  The   hip had been irrigated throughout the case again at this point.  I did   reapproximate the superior capsular leaflet to the anterior leaflet   using #1 Vicryl.  The fascia of the   tensor fascia lata muscle was then reapproximated using #1 Vicryl and #0 Stratafix sutures.  The   remaining wound was closed with 2-0 Vicryl and running 4-0 Monocryl.    The hip was cleaned, dried, and dressed sterilely using Dermabond and   Aquacel dressing.  The patient was then brought   to  recovery room in stable condition tolerating the procedure well.    Costella Hatcher, PA-C was present for the entirety of the case involved from   preoperative positioning, perioperative retractor management, general   facilitation of the case, as well as primary wound closure as assistant.            Pietro Cassis Alvan Dame, M.D.        09/12/2021 11:16 AM

## 2021-09-12 NOTE — Plan of Care (Signed)

## 2021-09-12 NOTE — Anesthesia Preprocedure Evaluation (Signed)
Anesthesia Evaluation  Patient identified by MRN, date of birth, ID band Patient awake    Reviewed: Allergy & Precautions, NPO status , Patient's Chart, lab work & pertinent test results  History of Anesthesia Complications (+) PONV and history of anesthetic complications  Airway Mallampati: I       Dental no notable dental hx.    Pulmonary    Pulmonary exam normal        Cardiovascular hypertension, Pt. on medications Normal cardiovascular exam     Neuro/Psych negative psych ROS   GI/Hepatic   Endo/Other    Renal/GU   negative genitourinary   Musculoskeletal  (+) Arthritis , Osteoarthritis,    Abdominal Normal abdominal exam  (+)   Peds  Hematology negative hematology ROS (+)   Anesthesia Other Findings   Reproductive/Obstetrics                             Anesthesia Physical Anesthesia Plan  ASA: 2  Anesthesia Plan: Spinal   Post-op Pain Management:    Induction:   PONV Risk Score and Plan: 3 and Ondansetron, Dexamethasone and Midazolam  Airway Management Planned: Natural Airway and Simple Face Mask  Additional Equipment: None  Intra-op Plan:   Post-operative Plan:   Informed Consent: I have reviewed the patients History and Physical, chart, labs and discussed the procedure including the risks, benefits and alternatives for the proposed anesthesia with the patient or authorized representative who has indicated his/her understanding and acceptance.     Dental advisory given  Plan Discussed with: CRNA  Anesthesia Plan Comments:         Anesthesia Quick Evaluation

## 2021-09-12 NOTE — Anesthesia Postprocedure Evaluation (Signed)
Anesthesia Post Note  Patient: Jacqueline Tucker  Procedure(s) Performed: TOTAL HIP ARTHROPLASTY ANTERIOR APPROACH (Right: Hip)     Patient location during evaluation: PACU Anesthesia Type: Spinal Level of consciousness: awake Pain management: pain level controlled Vital Signs Assessment: post-procedure vital signs reviewed and stable Respiratory status: spontaneous breathing Cardiovascular status: stable Postop Assessment: no headache, no backache, spinal receding, patient able to bend at knees and no apparent nausea or vomiting Anesthetic complications: no   No notable events documented.  Last Vitals:  Vitals:   09/12/21 1530 09/12/21 1545  BP: 122/67 124/73  Pulse: 82 96  Resp: 14 13  Temp:    SpO2: 100% 100%    Last Pain:  Vitals:   09/12/21 1545  TempSrc:   PainSc: 0-No pain                 Huston Foley

## 2021-09-12 NOTE — Transfer of Care (Signed)
Immediate Anesthesia Transfer of Care Note  Patient: Jacqueline Tucker  Procedure(s) Performed: Procedure(s): TOTAL HIP ARTHROPLASTY ANTERIOR APPROACH (Right)  Patient Location: PACU  Anesthesia Type:Spinal  Level of Consciousness:  sedated, patient cooperative and responds to stimulation  Airway & Oxygen Therapy:Patient Spontanous Breathing and Patient connected to face mask oxgen  Post-op Assessment:  Report given to PACU RN and Post -op Vital signs reviewed and stable  Post vital signs:  Reviewed and stable  Last Vitals:  Vitals:   09/12/21 1058  BP: (!) 145/87  Pulse: 92  Resp: 16  Temp: 37.2 C  SpO2: 867%    Complications: No apparent anesthesia complications

## 2021-09-12 NOTE — Discharge Instructions (Addendum)
INSTRUCTIONS AFTER JOINT REPLACEMENT   Resume R[place] 2 weeks after surgery.  Remove items at home which could result in a fall. This includes throw rugs or furniture in walking pathways ICE to the affected joint every three hours while awake for 30 minutes at a time, for at least the first 3-5 days, and then as needed for pain and swelling.  Continue to use ice for pain and swelling. You may notice swelling that will progress down to the foot and ankle.  This is normal after surgery.  Elevate your leg when you are not up walking on it.   Continue to use the breathing machine you got in the hospital (incentive spirometer) which will help keep your temperature down.  It is common for your temperature to cycle up and down following surgery, especially at night when you are not up moving around and exerting yourself.  The breathing machine keeps your lungs expanded and your temperature down.   DIET:  As you were doing prior to hospitalization, we recommend a well-balanced diet.  DRESSING / WOUND CARE / SHOWERING  Keep the surgical dressing until follow up.  The dressing is water proof, so you can shower without any extra covering.  IF THE DRESSING FALLS OFF or the wound gets wet inside, change the dressing with sterile gauze.  Please use good hand washing techniques before changing the dressing.  Do not use any lotions or creams on the incision until instructed by your surgeon.    ACTIVITY  Increase activity slowly as tolerated, but follow the weight bearing instructions below.   No driving for 6 weeks or until further direction given by your physician.  You cannot drive while taking narcotics.  No lifting or carrying greater than 10 lbs. until further directed by your surgeon. Avoid periods of inactivity such as sitting longer than an hour when not asleep. This helps prevent blood clots.  You may return to work once you are authorized by your doctor.     WEIGHT BEARING   Weight bearing as  tolerated with assist device (walker, cane, etc) as directed, use it as long as suggested by your surgeon or therapist, typically at least 4-6 weeks.   EXERCISES  Results after joint replacement surgery are often greatly improved when you follow the exercise, range of motion and muscle strengthening exercises prescribed by your doctor. Safety measures are also important to protect the joint from further injury. Any time any of these exercises cause you to have increased pain or swelling, decrease what you are doing until you are comfortable again and then slowly increase them. If you have problems or questions, call your caregiver or physical therapist for advice.   Rehabilitation is important following a joint replacement. After just a few days of immobilization, the muscles of the leg can become weakened and shrink (atrophy).  These exercises are designed to build up the tone and strength of the thigh and leg muscles and to improve motion. Often times heat used for twenty to thirty minutes before working out will loosen up your tissues and help with improving the range of motion but do not use heat for the first two weeks following surgery (sometimes heat can increase post-operative swelling).   These exercises can be done on a training (exercise) mat, on the floor, on a table or on a bed. Use whatever works the best and is most comfortable for you.    Use music or television while you are exercising so that the  exercises are a pleasant break in your day. This will make your life better with the exercises acting as a break in your routine that you can look forward to.   Perform all exercises about fifteen times, three times per day or as directed.  You should exercise both the operative leg and the other leg as well.  Exercises include:   Quad Sets - Tighten up the muscle on the front of the thigh (Quad) and hold for 5-10 seconds.   Straight Leg Raises - With your knee straight (if you were given a  brace, keep it on), lift the leg to 60 degrees, hold for 3 seconds, and slowly lower the leg.  Perform this exercise against resistance later as your leg gets stronger.  Leg Slides: Lying on your back, slowly slide your foot toward your buttocks, bending your knee up off the floor (only go as far as is comfortable). Then slowly slide your foot back down until your leg is flat on the floor again.  Angel Wings: Lying on your back spread your legs to the side as far apart as you can without causing discomfort.  Hamstring Strength:  Lying on your back, push your heel against the floor with your leg straight by tightening up the muscles of your buttocks.  Repeat, but this time bend your knee to a comfortable angle, and push your heel against the floor.  You may put a pillow under the heel to make it more comfortable if necessary.   A rehabilitation program following joint replacement surgery can speed recovery and prevent re-injury in the future due to weakened muscles. Contact your doctor or a physical therapist for more information on knee rehabilitation.    CONSTIPATION  Constipation is defined medically as fewer than three stools per week and severe constipation as less than one stool per week.  Even if you have a regular bowel pattern at home, your normal regimen is likely to be disrupted due to multiple reasons following surgery.  Combination of anesthesia, postoperative narcotics, change in appetite and fluid intake all can affect your bowels.   YOU MUST use at least one of the following options; they are listed in order of increasing strength to get the job done.  They are all available over the counter, and you may need to use some, POSSIBLY even all of these options:    Drink plenty of fluids (prune juice may be helpful) and high fiber foods Colace 100 mg by mouth twice a day  Senokot for constipation as directed and as needed Dulcolax (bisacodyl), take with full glass of water  Miralax  (polyethylene glycol) once or twice a day as needed.  If you have tried all these things and are unable to have a bowel movement in the first 3-4 days after surgery call either your surgeon or your primary doctor.    If you experience loose stools or diarrhea, hold the medications until you stool forms back up.  If your symptoms do not get better within 1 week or if they get worse, check with your doctor.  If you experience "the worst abdominal pain ever" or develop nausea or vomiting, please contact the office immediately for further recommendations for treatment.   ITCHING:  If you experience itching with your medications, try taking only a single pain pill, or even half a pain pill at a time.  You can also use Benadryl over the counter for itching or also to help with sleep.   TED  HOSE STOCKINGS:  Use stockings on both legs until for at least 2 weeks or as directed by physician office. They may be removed at night for sleeping.  MEDICATIONS:  See your medication summary on the "After Visit Summary" that nursing will review with you.  You may have some home medications which will be placed on hold until you complete the course of blood thinner medication.  It is important for you to complete the blood thinner medication as prescribed.  PRECAUTIONS:  If you experience chest pain or shortness of breath - call 911 immediately for transfer to the hospital emergency department.   If you develop a fever greater that 101 F, purulent drainage from wound, increased redness or drainage from wound, foul odor from the wound/dressing, or calf pain - CONTACT YOUR SURGEON.                                                   FOLLOW-UP APPOINTMENTS:  If you do not already have a post-op appointment, please call the office for an appointment to be seen by your surgeon.  Guidelines for how soon to be seen are listed in your "After Visit Summary", but are typically between 1-4 weeks after surgery.  OTHER  INSTRUCTIONS:   Knee Replacement:  Do not place pillow under knee, focus on keeping the knee straight while resting. CPM instructions: 0-90 degrees, 2 hours in the morning, 2 hours in the afternoon, and 2 hours in the evening. Place foam block, curve side up under heel at all times except when in CPM or when walking.  DO NOT modify, tear, cut, or change the foam block in any way.  POST-OPERATIVE OPIOID TAPER INSTRUCTIONS: It is important to wean off of your opioid medication as soon as possible. If you do not need pain medication after your surgery it is ok to stop day one. Opioids include: Codeine, Hydrocodone(Norco, Vicodin), Oxycodone(Percocet, oxycontin) and hydromorphone amongst others.  Long term and even short term use of opiods can cause: Increased pain response Dependence Constipation Depression Respiratory depression And more.  Withdrawal symptoms can include Flu like symptoms Nausea, vomiting And more Techniques to manage these symptoms Hydrate well Eat regular healthy meals Stay active Use relaxation techniques(deep breathing, meditating, yoga) Do Not substitute Alcohol to help with tapering If you have been on opioids for less than two weeks and do not have pain than it is ok to stop all together.  Plan to wean off of opioids This plan should start within one week post op of your joint replacement. Maintain the same interval or time between taking each dose and first decrease the dose.  Cut the total daily intake of opioids by one tablet each day Next start to increase the time between doses. The last dose that should be eliminated is the evening dose.   MAKE SURE YOU:  Understand these instructions.  Get help right away if you are not doing well or get worse.    Thank you for letting us be a part of your medical care team.  It is a privilege we respect greatly.  We hope these instructions will help you stay on track for a fast and full recovery!

## 2021-09-13 DIAGNOSIS — Z96642 Presence of left artificial hip joint: Secondary | ICD-10-CM | POA: Diagnosis not present

## 2021-09-13 DIAGNOSIS — Z91040 Latex allergy status: Secondary | ICD-10-CM | POA: Diagnosis not present

## 2021-09-13 DIAGNOSIS — I10 Essential (primary) hypertension: Secondary | ICD-10-CM | POA: Diagnosis not present

## 2021-09-13 DIAGNOSIS — Z85828 Personal history of other malignant neoplasm of skin: Secondary | ICD-10-CM | POA: Diagnosis not present

## 2021-09-13 DIAGNOSIS — M1611 Unilateral primary osteoarthritis, right hip: Secondary | ICD-10-CM | POA: Diagnosis not present

## 2021-09-13 DIAGNOSIS — Z79899 Other long term (current) drug therapy: Secondary | ICD-10-CM | POA: Diagnosis not present

## 2021-09-13 LAB — CBC
HCT: 26 % — ABNORMAL LOW (ref 36.0–46.0)
Hemoglobin: 8.7 g/dL — ABNORMAL LOW (ref 12.0–15.0)
MCH: 32.8 pg (ref 26.0–34.0)
MCHC: 33.5 g/dL (ref 30.0–36.0)
MCV: 98.1 fL (ref 80.0–100.0)
Platelets: 139 10*3/uL — ABNORMAL LOW (ref 150–400)
RBC: 2.65 MIL/uL — ABNORMAL LOW (ref 3.87–5.11)
RDW: 13.8 % (ref 11.5–15.5)
WBC: 6.6 10*3/uL (ref 4.0–10.5)
nRBC: 0 % (ref 0.0–0.2)

## 2021-09-13 LAB — BASIC METABOLIC PANEL
Anion gap: 4 — ABNORMAL LOW (ref 5–15)
BUN: 15 mg/dL (ref 8–23)
CO2: 26 mmol/L (ref 22–32)
Calcium: 8 mg/dL — ABNORMAL LOW (ref 8.9–10.3)
Chloride: 104 mmol/L (ref 98–111)
Creatinine, Ser: 0.65 mg/dL (ref 0.44–1.00)
GFR, Estimated: >60 mL/min (ref >60)
Glucose, Bld: 141 mg/dL — ABNORMAL HIGH (ref 70–99)
Potassium: 4.1 mmol/L (ref 3.5–5.1)
Sodium: 134 mmol/L — ABNORMAL LOW (ref 135–145)

## 2021-09-13 MED ORDER — DOCUSATE SODIUM 100 MG PO CAPS
100.0000 mg | ORAL_CAPSULE | Freq: Two times a day (BID) | ORAL | 0 refills | Status: DC
Start: 2021-09-13 — End: 2023-06-06

## 2021-09-13 MED ORDER — POLYETHYLENE GLYCOL 3350 17 G PO PACK
17.0000 g | PACK | Freq: Every day | ORAL | 0 refills | Status: DC | PRN
Start: 2021-09-13 — End: 2023-06-06

## 2021-09-13 MED ORDER — ASPIRIN 81 MG PO CHEW
81.0000 mg | CHEWABLE_TABLET | Freq: Two times a day (BID) | ORAL | 0 refills | Status: AC
Start: 2021-09-13 — End: 2021-10-11

## 2021-09-13 MED ORDER — METHOCARBAMOL 500 MG PO TABS
500.0000 mg | ORAL_TABLET | Freq: Four times a day (QID) | ORAL | 0 refills | Status: DC | PRN
Start: 2021-09-13 — End: 2023-06-06

## 2021-09-13 MED ORDER — ACETAMINOPHEN 500 MG PO TABS: 1000.0000 mg | ORAL_TABLET | Freq: Four times a day (QID) | ORAL | 0 refills | Status: AC | PRN

## 2021-09-13 NOTE — TOC Transition Note (Signed)
Transition of Care [facility]) - CM/SW Discharge Note   Patient Details  Name: Jacqueline Tucker MRN: [id number] Date of Birth: 1951/09/12  [facility] CM/SW Contact:  Lennart Pall, LCSW Phone Number: 09/13/2021, 11:15 AM   Clinical Narrative:     Met briefly with pt and confirming she has all needed DME.  Plan for HEP.  No TOC needs.  Final next level of care: Home/Self Care Barriers to Discharge: No Barriers Identified   Patient Goals and CMS Choice Patient states their goals for this hospitalization and ongoing recovery are:: return home      Discharge Placement                       Discharge Plan and Services                DME Arranged: N/A DME Agency: NA       HH Arranged: NA HH Agency: NA        Social Determinants of Health (SDOH) Interventions     Readmission Risk Interventions No flowsheet data found.

## 2021-09-13 NOTE — Progress Notes (Signed)
   Subjective: 1 Day Post-Op Procedure(s) (LRB): TOTAL HIP ARTHROPLASTY ANTERIOR APPROACH (Right) Patient reports pain as mild.   Patient seen in rounds by Dr. Alvan Dame. Patient is resting in bed on exam this morning. Foley catheter removed. She did not get up with PT yesterday. We will start therapy today.   Objective: Vital signs in last 24 hours: Temp:  [97.7 F (36.5 C)-98.9 F (37.2 C)] 98.1 F (36.7 C) (11/23 0528) Pulse Rate:  [71-96] 84 (11/23 0528) Resp:  [12-19] 15 (11/23 0528) BP: (93-145)/(55-88) 96/61 (11/23 0528) SpO2:  [98 %-100 %] 100 % (11/23 0528) Weight:  [68.5 kg] 68.5 kg (11/22 1057)  Intake/Output from previous day:  Intake/Output Summary (Last 24 hours) at 09/13/2021 0714 Last data filed at 09/13/2021 0600 Gross per 24 hour  Intake 3658.75 ml  Output 3025 ml  Net 633.75 ml     Intake/Output this shift: No intake/output data recorded.  Labs: Recent Labs    09/13/21 0310  HGB 8.7*   Recent Labs    09/13/21 0310  WBC 6.6  RBC 2.65*  HCT 26.0*  PLT 139*   Recent Labs    09/13/21 0310  NA 134*  K 4.1  CL 104  CO2 26  BUN 15  CREATININE 0.65  GLUCOSE 141*  CALCIUM 8.0*   No results for input(s): LABPT, INR in the last 72 hours.  Exam: General - Patient is Alert and Oriented Extremity - Neurologically intact Sensation intact distally Intact pulses distally Dorsiflexion/Plantar flexion intact Dressing - dressing C/D/I Motor Function - intact, moving foot and toes well on exam.   Past Medical History:  Diagnosis Date   Abnormal Pap smear of cervix 10/99   LGSIL   Arthritis    Basal cell carcinoma (BCC)    On hand   Complication of anesthesia    Headache   GERD (gastroesophageal reflux disease)    Headache(784.0)    migraines   Hypertension    Osteoporosis    Pneumonia    PONV (postoperative nausea and vomiting)    Rheumatoid arthritis (HCC)     Assessment/Plan: 1 Day Post-Op Procedure(s) (LRB): TOTAL HIP  ARTHROPLASTY ANTERIOR APPROACH (Right) Principal Problem:   S/P total hip arthroplasty Active Problems:   S/P total right hip arthroplasty  Estimated body mass index is 25.53 kg/m as calculated from the following:   Height as of this encounter: 5' 4.5" (1.638 m).   Weight as of this encounter: 68.5 kg. Advance diet Up with therapy D/C IV fluids  DVT Prophylaxis - Aspirin Weight bearing as tolerated.  ABLA: Hgb 8.7 this AM.   Plan is to go Home after hospital stay. She reports she did have decent pain last night. We held off on stronger pain medication due to previous issue with fainting, but after talking I am not convinced this was related to the medication. We will try hydrocodone today, and send this home with her to use as needed. She does have tramadol at home. Follow up in the office in 2 weeks.   Griffith Citron, PA-C Orthopedic Surgery [phone] 09/13/2021, 7:14 AM

## 2021-09-13 NOTE — Plan of Care (Signed)
  Problem: Activity: Goal: Risk for activity intolerance will decrease Outcome: Progressing   Problem: Pain Managment: Goal: General experience of comfort will improve Outcome: Progressing   Problem: Safety: Goal: Ability to remain free from injury will improve Outcome: Progressing   

## 2021-09-13 NOTE — Progress Notes (Signed)
Discharge package printed and instructions given to patient. All questions were answered to pt's satisfaction.

## 2021-09-13 NOTE — Progress Notes (Signed)
Physical Therapy Treatment Patient Details Name: Jacqueline Tucker MRN: [id number] DOB: 26-Feb-1951 Today's Date: 09/13/2021   History of Present Illness 70 y.o. female admitted 09/12/21 for R AA-THA. 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    PT Comments    Pt is progressing well and is ready to DC home from a PT standpoint. STair training completed. Pt demonstrates good understanding of HEP.    Recommendations for follow up therapy are one component of a multi-disciplinary discharge planning process, led by the attending physician.  Recommendations may be updated based on patient status, additional functional criteria and insurance authorization.  Follow Up Recommendations  Follow physician's recommendations for discharge plan and follow up therapies     Assistance Recommended at Discharge Intermittent Supervision/Assistance  Equipment Recommendations  None recommended by PT    Recommendations for Other Services       Precautions / Restrictions Precautions Precautions: Fall Precaution Comments: Fall hx: dog has pulled her down 2 times in last 6 months, no other falls Restrictions Weight Bearing Restrictions: No Other Position/Activity Restrictions: WBAT     Mobility  Bed Mobility Overal bed mobility: Needs Assistance Bed Mobility: Supine to Sit     Supine to sit: Mod assist     General bed mobility comments: up in recliner    Transfers Overall transfer level: Needs assistance Equipment used: Rolling walker (2 wheels) Transfers: Sit to/from Stand Sit to Stand: Supervision           General transfer comment: VCs hand placement    Ambulation/Gait Ambulation/Gait assistance: Supervision Gait Distance (Feet): 130 Feet Assistive device: Rolling walker (2 wheels) Gait Pattern/deviations: Step-to pattern;Decreased step length - right;Decreased step length - left Gait velocity: decr     General Gait Details: VCs sequencing, no loss of balance, 6-7/10 R hip  pain with walking   Stairs Stairs: Yes Stairs assistance: Min guard Stair Management: Step to pattern;Forwards;With cane;One rail Right Number of Stairs: 5 General stair comments: VCs sequencing   Wheelchair Mobility    Modified Rankin (Stroke Patients Only)       Balance Overall balance assessment: Modified Independent                                          Cognition Arousal/Alertness: Awake/alert Behavior During Therapy: WFL for tasks assessed/performed Overall Cognitive Status: Within Functional Limits for tasks assessed                                          Exercises Total Joint Exercises Ankle Circles/Pumps: AROM;Both;Supine;15 reps Quad Sets: AROM;Both;5 reps;Supine Short Arc Quad: AROM;Right;10 reps;Supine Heel Slides: AAROM;Right;Supine;10 reps Hip ABduction/ADduction: AAROM;Right;10 reps;Supine Long Arc Quad: AROM;Right;5 reps;Seated    General Comments        Pertinent Vitals/Pain Pain Assessment: 0-10 Pain Score: 3  Pain Location: R hip with walking Pain Descriptors / Indicators: Sore Pain Intervention(s): Limited activity within patient's tolerance;Monitored during session;Ice applied    Home Living                          Prior Function            PT Goals (current goals can now be found in the care plan section) Acute Rehab PT Goals Patient Stated  Goal: walk without pain PT Goal Formulation: With patient Time For Goal Achievement: 09/20/21 Potential to Achieve Goals: Good Progress towards PT goals: Progressing toward goals    Frequency    7X/week      PT Plan Current plan remains appropriate    Co-evaluation              AM-PAC PT "6 Clicks" Mobility   Outcome Measure  Help needed turning from your back to your side while in a flat bed without using bedrails?: A Little Help needed moving from lying on your back to sitting on the side of a flat bed without using  bedrails?: A Little Help needed moving to and from a bed to a chair (including a wheelchair)?: A Little Help needed standing up from a chair using your arms (e.g., wheelchair or bedside chair)?: A Little Help needed to walk in hospital room?: A Little Help needed climbing 3-5 steps with a railing? : A Little 6 Click Score: 18    End of Session Equipment Utilized During Treatment: Gait belt Activity Tolerance: Patient limited by fatigue;Patient limited by pain Patient left: in chair;with chair alarm set;with family/visitor present Nurse Communication: Mobility status PT Visit Diagnosis: Difficulty in walking, not elsewhere classified (R26.2);Pain Pain - Right/Left: Right Pain - part of body: Hip     Time: 2446-2863 PT Time Calculation (min) (ACUTE ONLY): 28 min  Charges:  $Gait Training: 8-22 mins $Therapeutic Exercise: 8-22 mins                    Blondell Reveal Kistler PT 09/13/2021  Acute Rehabilitation Services Pager [phone] Office [phone]

## 2021-09-13 NOTE — Plan of Care (Signed)
  Problem: Education: Goal: Knowledge of General Education information will improve Description: Including pain rating scale, medication(s)/side effects and non-pharmacologic comfort measures Outcome: Progressing   Problem: Nutrition: Goal: Adequate nutrition will be maintained Outcome: Progressing   

## 2021-09-13 NOTE — Evaluation (Signed)
Physical Therapy Evaluation Patient Details Name: Jacqueline Tucker MRN: [id number] DOB: [birth year] Today's Date: 09/13/2021  History of Present Illness  70 y.o. female admitted 09/12/21 for R AA-THA. 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  Clinical Impression  Pt admitted with above diagnosis. Pt ambulated 72' with RW, distance limited by pain & fatigue. Initiated THA HEP. Will plan a second session to complete stair training and HEP instruction.  Pt currently with functional limitations due to the deficits listed below (see PT Problem List). Pt will benefit from skilled PT to increase their independence and safety with mobility to allow discharge to the venue listed below.          Recommendations for follow up therapy are one component of a multi-disciplinary discharge planning process, led by the attending physician.  Recommendations may be updated based on patient status, additional functional criteria and insurance authorization.  Follow Up Recommendations Follow physician's recommendations for discharge plan and follow up therapies    Assistance Recommended at Discharge Intermittent Supervision/Assistance  Functional Status Assessment Patient has had a recent decline in their functional status and demonstrates the ability to make significant improvements in function in a reasonable and predictable amount of time.  Equipment Recommendations  None recommended by PT    Recommendations for Other Services       Precautions / Restrictions Precautions Precautions: Fall Precaution Comments: Fall hx: dog has pulled her down 2 times in last 6 months, no other falls Restrictions Weight Bearing Restrictions: No Other Position/Activity Restrictions: WBAT      Mobility  Bed Mobility Overal bed mobility: Needs Assistance Bed Mobility: Supine to Sit     Supine to sit: Mod assist     General bed mobility comments: assist to pivot hips, raise trunk, and support RLE     Transfers Overall transfer level: Needs assistance Equipment used: Rolling walker (2 wheels) Transfers: Sit to/from Stand Sit to Stand: Min assist           General transfer comment: VCs hand placement, min A to power up    Ambulation/Gait Ambulation/Gait assistance: Min guard Gait Distance (Feet): 60 Feet Assistive device: Rolling walker (2 wheels) Gait Pattern/deviations: Step-to pattern;Decreased step length - right;Decreased step length - left Gait velocity: decr     General Gait Details: VCs sequencing, no loss of balance, 6-7/10 R hip pain with walking  Stairs            Wheelchair Mobility    Modified Rankin (Stroke Patients Only)       Balance Overall balance assessment: Modified Independent                                           Pertinent Vitals/Pain Pain Assessment: 0-10 Pain Score: 6  Pain Location: R hip with walking Pain Descriptors / Indicators: Sore Pain Intervention(s): Limited activity within patient's tolerance;Monitored during session;Premedicated before session;Ice applied    Home Living Family/patient expects to be discharged to:: Private residence Living Arrangements: Spouse/significant other Available Help at Discharge: Family;Available 24 hours/day Type of Home: House Home Access: Stairs to enter Entrance Stairs-Rails: Right Entrance Stairs-Number of Steps: 4   Home Layout: One level Home Equipment: Conservation officer, nature (2 wheels);BSC/3in1;Cane - single point      Prior Function Prior Level of Function : Independent/Modified Independent             Mobility Comments:  independent without AD       Hand Dominance        Extremity/Trunk Assessment   Upper Extremity Assessment Upper Extremity Assessment: Overall WFL for tasks assessed    Lower Extremity Assessment Lower Extremity Assessment: RLE deficits/detail RLE Deficits / Details: knee ext -3/5, hip +2/5 RLE Sensation: WNL RLE  Coordination: WNL    Cervical / Trunk Assessment Cervical / Trunk Assessment: Normal  Communication   Communication: No difficulties  Cognition Arousal/Alertness: Awake/alert Behavior During Therapy: WFL for tasks assessed/performed Overall Cognitive Status: Within Functional Limits for tasks assessed                                          General Comments      Exercises Total Joint Exercises Ankle Circles/Pumps: AROM;Both;Supine;15 reps Heel Slides: AAROM;Right;Supine;10 reps Hip ABduction/ADduction: AAROM;Right;10 reps;Supine Long Arc Quad: AROM;Right;5 reps;Seated   Assessment/Plan    PT Assessment Patient needs continued PT services  PT Problem List Decreased strength;Decreased range of motion;Decreased activity tolerance;Decreased mobility;Pain;Decreased knowledge of use of DME       PT Treatment Interventions DME instruction;Therapeutic activities;Gait training;Therapeutic exercise;Stair training;Patient/family education;Functional mobility training    PT Goals (Current goals can be found in the Care Plan section)  Acute Rehab PT Goals Patient Stated Goal: walk without pain PT Goal Formulation: With patient Time For Goal Achievement: 09/20/21 Potential to Achieve Goals: Good    Frequency 7X/week   Barriers to discharge        Co-evaluation               AM-PAC PT "6 Clicks" Mobility  Outcome Measure Help needed turning from your back to your side while in a flat bed without using bedrails?: A Little Help needed moving from lying on your back to sitting on the side of a flat bed without using bedrails?: A Lot Help needed moving to and from a bed to a chair (including a wheelchair)?: A Little Help needed standing up from a chair using your arms (e.g., wheelchair or bedside chair)?: A Little Help needed to walk in hospital room?: A Little Help needed climbing 3-5 steps with a railing? : A Lot 6 Click Score: 16    End of Session  Equipment Utilized During Treatment: Gait belt Activity Tolerance: Patient limited by fatigue;Patient limited by pain Patient left: in chair;with chair alarm set;with family/visitor present Nurse Communication: Mobility status PT Visit Diagnosis: Difficulty in walking, not elsewhere classified (R26.2);Pain Pain - Right/Left: Right Pain - part of body: Hip    Time: 5859-2924 PT Time Calculation (min) (ACUTE ONLY): 39 min   Charges:   PT Evaluation $PT Eval Moderate Complexity: 1 Mod PT Treatments $Gait Training: 8-22 mins $Therapeutic Exercise: 8-22 mins       Blondell Reveal Kistler PT 09/13/2021  Acute Rehabilitation Services Pager [phone] Office [phone]

## 2021-09-20 ENCOUNTER — Encounter (HOSPITAL_COMMUNITY): Payer: Self-pay | Admitting: Orthopedic Surgery

## 2021-09-26 NOTE — Discharge Summary (Signed)
Physician Discharge Summary   Patient ID: Jacqueline Tucker MRN: [id number] DOB/AGE: Mar 11, 1951 70 y.o.  Admit date: 09/12/2021 Discharge date: 09/13/2021  Primary Diagnosis: Right  hip osteoarthritis.   Admission Diagnoses:  Past Medical History:  Diagnosis Date   Abnormal Pap smear of cervix 10/99   LGSIL   Arthritis    Basal cell carcinoma (BCC)    On hand   Complication of anesthesia    Headache   GERD (gastroesophageal reflux disease)    Headache(784.0)    migraines   Hypertension    Osteoporosis    Pneumonia    PONV (postoperative nausea and vomiting)    Rheumatoid arthritis ([place])    Discharge Diagnoses:   Principal Problem:   S/P total hip arthroplasty Active Problems:   S/P total right hip arthroplasty  Estimated body mass index is 25.53 kg/m as calculated from the following:   Height as of this encounter: 5' 4.5" (1.638 m).   Weight as of this encounter: 68.5 kg.  Procedure:  Procedure(s) (LRB): TOTAL HIP ARTHROPLASTY ANTERIOR APPROACH (Right)   Consults: None  HPI: Jacqueline Tucker is a 70 y.o. female who had   presented to office for evaluation of right hip pain.  Radiographs revealed   progressive degenerative changes with bone-on-bone   articulation of the  hip joint, including subchondral cystic changes and osteophytes.  The patient had painful limited range of   motion significantly affecting their overall quality of life and function.  The patient was failing to    respond to conservative measures including medications and/or injections and activity modification and at this point was ready   to proceed with more definitive measures.  Consent was obtained for   benefit of pain relief.  Specific risks of infection, DVT, component   failure, dislocation, neurovascular injury, and need for revision surgery were reviewed in the office as well discussion of   the anterior versus posterior approach were reviewed.  Laboratory Data: Admission on 09/12/2021,  Discharged on 09/13/2021  Component Date Value Ref Range Status   WBC 09/13/2021 6.6  4.0 - 10.5 K/uL Final   RBC 09/13/2021 2.65 (L)  3.87 - 5.11 MIL/uL Final   Hemoglobin 09/13/2021 8.7 (L)  12.0 - 15.0 g/dL Final   HCT 09/13/2021 26.0 (L)  36.0 - 46.0 % Final   MCV 09/13/2021 98.1  80.0 - 100.0 fL Final   MCH 09/13/2021 32.8  26.0 - 34.0 pg Final   MCHC 09/13/2021 33.5  30.0 - 36.0 g/dL Final   RDW 09/13/2021 13.8  11.5 - 15.5 % Final   Platelets 09/13/2021 139 (L)  150 - 400 K/uL Final   nRBC 09/13/2021 0.0  0.0 - 0.2 % Final   Performed at [facility], [place] [address] [place], [place] [address]   Sodium 09/13/2021 134 (L)  135 - 145 mmol/L Final   Potassium 09/13/2021 4.1  3.5 - 5.1 mmol/L Final   Chloride 09/13/2021 104  98 - 111 mmol/L Final   CO2 09/13/2021 26  22 - 32 mmol/L Final   Glucose, Bld 09/13/2021 141 (H)  70 - 99 mg/dL Final   Glucose reference range applies only to samples taken after fasting for at least 8 hours.   BUN 09/13/2021 15  8 - 23 mg/dL Final   Creatinine, Ser 09/13/2021 0.65  0.44 - 1.00 mg/dL Final   Calcium 09/13/2021 8.0 (L)  8.9 - 10.3 mg/dL Final   GFR, Estimated 09/13/2021 >60  >60 mL/min  Final   Comment: (NOTE) Calculated using the CKD-EPI Creatinine Equation (2021)    Anion gap 09/13/2021 4 (L)  5 - 15 Final   Performed at [facility], [place] [address], [place],  [address]  Orders Only on 09/08/2021  Component Date Value Ref Range Status   SARS Coronavirus 2 09/08/2021 RESULT: NEGATIVE   Final   Comment: RESULT: NEGATIVESARS-CoV-2 INTERPRETATION:A NEGATIVE  test result means that SARS-CoV-2 RNA was not present in the specimen above the limit of detection of this test. This does not preclude a possible SARS-CoV-2 infection and should not be used as the  sole basis for patient management decisions. Negative results must be combined with clinical observations, patient history, and epidemiological  information. Optimum specimen types and timing for peak viral levels during infections caused by SARS-CoV-2  have not been determined. Collection of multiple specimens or types of specimens may be necessary to detect virus. Improper specimen collection and handling, sequence variability under primers/probes, or organism present below the limit of detection may  lead to false negative results. Positive and negative predictive values of testing are highly dependent on prevalence. False negative test results are more likely when prevalence of disease is high.The expected result is NEGATIVE.Fact S                          heet for  Healthcare Providers: LocalChronicle.no Sheet for Patients: SalonLookup.es Reference Range - Negative   Hospital Outpatient Visit on 08/30/2021  Component Date Value Ref Range Status   WBC 08/30/2021 3.5 (L)  4.0 - 10.5 K/uL Final   RBC 08/30/2021 3.68 (L)  3.87 - 5.11 MIL/uL Final   Hemoglobin 08/30/2021 12.0  12.0 - 15.0 g/dL Final   HCT 08/30/2021 36.2  36.0 - 46.0 % Final   MCV 08/30/2021 98.4  80.0 - 100.0 fL Final   MCH 08/30/2021 32.6  26.0 - 34.0 pg Final   MCHC 08/30/2021 33.1  30.0 - 36.0 g/dL Final   RDW 08/30/2021 13.5  11.5 - 15.5 % Final   Platelets 08/30/2021 237  150 - 400 K/uL Final   nRBC 08/30/2021 0.0  0.0 - 0.2 % Final   Performed at [facility], [place] [address] [place], [place] [address]   Sodium 08/30/2021 132 (L)  135 - 145 mmol/L Final   Potassium 08/30/2021 4.2  3.5 - 5.1 mmol/L Final   Chloride 08/30/2021 101  98 - 111 mmol/L Final   CO2 08/30/2021 25  22 - 32 mmol/L Final   Glucose, Bld 08/30/2021 93  70 - 99 mg/dL Final   Glucose reference range applies only to samples taken after fasting for at least 8 hours.   BUN 08/30/2021 26 (H)  8 - 23 mg/dL Final   Creatinine, Ser 08/30/2021 0.68  0.44 - 1.00 mg/dL Final   Calcium 08/30/2021 8.8 (L)  8.9 - 10.3 mg/dL  Final   Total Protein 08/30/2021 7.1  6.5 - 8.1 g/dL Final   Albumin 08/30/2021 4.5  3.5 - 5.0 g/dL Final   AST 08/30/2021 23  15 - 41 U/L Final   ALT 08/30/2021 23  0 - 44 U/L Final   Alkaline Phosphatase 08/30/2021 28 (L)  38 - 126 U/L Final   Total Bilirubin 08/30/2021 0.9  0.3 - 1.2 mg/dL Final   GFR, Estimated 08/30/2021 >60  >60 mL/min Final   Comment: (NOTE) Calculated using the CKD-EPI Creatinine Equation (2021)    Anion  gap 08/30/2021 6  5 - 15 Final   Performed at [facility], [place] [address], [place], [place] [address]   ABO/RH(D) 08/30/2021 A POS   Final   Antibody Screen 08/30/2021 NEG   Final   Sample Expiration 08/30/2021 09/13/2021,2359   Final   Extend sample reason 08/30/2021    Final                   Value:NO TRANSFUSIONS OR PREGNANCY IN THE PAST 3 MONTHS Performed at [place] [address], [place], [place] [address]      X-Rays:DG Pelvis Portable  Result Date: 09/12/2021 CLINICAL DATA:  Right hip arthroplasty EXAM: PORTABLE PELVIS 1-2 VIEWS COMPARISON:  Same-day intraoperative radiographs, pelvis radiographs 06/28/2020 FINDINGS: There has been interval right hip arthroplasty. Hardware alignment is within expected limits, without evidence of hardware related complication. There is expected postoperative gas about the right hip. Left hip arthroplasty hardware is stable, without evidence of complication. The SI joints and symphysis pubis are intact. IMPRESSION: Status post right hip arthroplasty without evidence of complication Electronically Signed   By: Jacqueline Tucker M.D.   On: 09/12/2021 15:13   DG C-Arm 1-60 Min-No Report  Result Date: 09/12/2021 Fluoroscopy was utilized by the requesting physician.  No radiographic interpretation.   DG HIP UNILAT WITH PELVIS 1V RIGHT  Result Date: 09/12/2021 CLINICAL DATA:  Right hip replacement EXAM: DG HIP (WITH OR WITHOUT PELVIS) 1V RIGHT COMPARISON:  Pelvis radiographs  06/28/2020 FINDINGS: Two C-arm fluoroscopic images were obtained intraoperatively and submitted for post operative interpretation. The patient is status post right hip arthroplasty. Hardware alignment is within expected limits, without evidence of complication. The symphysis pubis is intact. Postsurgical changes on the left are also noted, incompletely imaged. Fluoro time 7 seconds please see the performing provider's procedural report for further detail. IMPRESSION: Status post right hip arthroplasty without evidence of immediate complication. Electronically Signed   By: Jacqueline Tucker M.D.   On: 09/12/2021 15:11    EKG: Orders placed or performed during the hospital encounter of 08/30/21   EKG 12 lead per protocol   EKG 12 lead per protocol   EKG 12-Lead   EKG 12-Lead     Hospital Course: Jacqueline Tucker is a 70 y.o. who was admitted to [facility] They were brought to the operating room on 09/12/2021 and underwent Procedure(s): TOTAL HIP ARTHROPLASTY ANTERIOR APPROACH.  Patient tolerated the procedure well and was later transferred to the recovery room and then to the orthopaedic floor for postoperative care. They were given PO and IV analgesics for pain control following their surgery. They were given 24 hours of postoperative antibiotics of  Anti-infectives (From admission, onward)    Start     Dose/Rate Route Frequency Ordered Stop   09/12/21 1830  ceFAZolin (ANCEF) IVPB 2g/100 mL premix        2 g 200 mL/hr over 30 Minutes Intravenous Every 6 hours 09/12/21 1707 09/13/21 0102   09/12/21 1045  ceFAZolin (ANCEF) IVPB 2g/100 mL premix        2 g 200 mL/hr over 30 Minutes Intravenous On call to O.R. 09/12/21 1033 09/12/21 1243      and started on DVT prophylaxis in the form of Aspirin.   PT and OT were ordered for total joint protocol. Discharge planning consulted to help with postop disposition and equipment needs.  Patient had a good night on the evening of surgery. They started to  get  up OOB with therapy on POD #1. Pt was seen during rounds and was ready to go home pending progress with therapy.She worked with therapy on POD #1 and was meeting her goals. Pt was discharged to home later that day in stable condition.  Diet: Regular diet Activity: WBAT Follow-up: in 2 weeks Disposition: Home Discharged Condition: good   Discharge Instructions     Call MD / Call 911   Complete by: As directed    If you experience chest pain or shortness of breath, CALL 911 and be transported to the hospital emergency room.  If you develope a fever above 101 F, pus (white drainage) or increased drainage or redness at the wound, or calf pain, call your surgeon's office.   Change dressing   Complete by: As directed    Maintain surgical dressing until follow up in the clinic. If the edges start to pull up, may reinforce with tape. If the dressing is no longer working, may remove and cover with gauze and tape, but must keep the area dry and clean.  Call with any questions or concerns.   Constipation Prevention   Complete by: As directed    Drink plenty of fluids.  Prune juice may be helpful.  You may use a stool softener, such as Colace (over the counter) 100 mg twice a day.  Use MiraLax (over the counter) for constipation as needed.   Diet - low sodium heart healthy   Complete by: As directed    Increase activity slowly as tolerated   Complete by: As directed    Weight bearing as tolerated with assist device (walker, cane, etc) as directed, use it as long as suggested by your surgeon or therapist, typically at least 4-6 weeks.   Post-operative opioid taper instructions:   Complete by: As directed    POST-OPERATIVE OPIOID TAPER INSTRUCTIONS: It is important to wean off of your opioid medication as soon as possible. If you do not need pain medication after your surgery it is ok to stop day one. Opioids include: Codeine, Hydrocodone(Norco, Vicodin), Oxycodone(Percocet, oxycontin) and  hydromorphone amongst others.  Long term and even short term use of opiods can cause: Increased pain response Dependence Constipation Depression Respiratory depression And more.  Withdrawal symptoms can include Flu like symptoms Nausea, vomiting And more Techniques to manage these symptoms Hydrate well Eat regular healthy meals Stay active Use relaxation techniques(deep breathing, meditating, yoga) Do Not substitute Alcohol to help with tapering If you have been on opioids for less than two weeks and do not have pain than it is ok to stop all together.  Plan to wean off of opioids This plan should start within one week post op of your joint replacement. Maintain the same interval or time between taking each dose and first decrease the dose.  Cut the total daily intake of opioids by one tablet each day Next start to increase the time between doses. The last dose that should be eliminated is the evening dose.      TED hose   Complete by: As directed    Use stockings (TED hose) for 2 weeks on both leg(s).  You may remove them at night for sleeping.      Allergies as of 09/13/2021       Reactions   Codeine    fainting   Latex Itching   Neosporin [neomycin-bacitracin Zn-polymyx] Rash        Medication List     STOP taking these medications  Fish Oil 1000 MG Caps   Rinvoq 15 MG Tb24 Generic drug: Upadacitinib ER       TAKE these medications    acetaminophen 500 MG tablet Commonly known as: TYLENOL Take 2 tablets (1,000 mg total) by mouth every 6 (six) hours as needed for moderate pain. Do not take if using Hydrocodone consistently, which does contain Tylenol What changed: additional instructions   aspirin 81 MG chewable tablet Chew 1 tablet (81 mg total) by mouth 2 (two) times daily for 28 days.   Biotin 1000 MCG tablet Take 1,000 mcg by mouth daily.   CALCIUM 600 + D PO Take 1 tablet by mouth every other day.   celecoxib 200 MG capsule Commonly  known as: CELEBREX Take 200 mg by mouth 2 (two) times daily.   denosumab 60 MG/ML Soln injection Commonly known as: PROLIA Inject 60 mg into the skin every 6 (six) months. Administer in upper arm, thigh, or abdomen   docusate sodium 100 MG capsule Commonly known as: COLACE Take 1 capsule (100 mg total) by mouth 2 (two) times daily.   folic acid 1 MG tablet Commonly known as: FOLVITE Take 2 mg by mouth daily.   gabapentin 100 MG capsule Commonly known as: NEURONTIN Take 100-200 mg by mouth See admin instructions. Take 100 mg in the morning and 200 mg at night   lisinopril 20 MG tablet Commonly known as: ZESTRIL Take 40 mg by mouth daily.   LUBRICATING EYE DROPS OP Place 1 drop into both eyes daily as needed (dry eyes).   methocarbamol 500 MG tablet Commonly known as: ROBAXIN Take 1 tablet (500 mg total) by mouth every 6 (six) hours as needed for muscle spasms.   methotrexate 2.5 MG tablet Commonly known as: RHEUMATREX Take 20 mg by mouth every Sunday. Caution:Chemotherapy. Protect from light.   multivitamin with minerals Tabs tablet Take 1 tablet by mouth every other day.   polyethylene glycol 17 g packet Commonly known as: MIRALAX / GLYCOLAX Take 17 g by mouth daily as needed for mild constipation.   Vitamin D 50 MCG (2000 UT) Caps Take 2,000 Units by mouth daily.               Discharge Care Instructions  (From admission, onward)           Start     Ordered   09/13/21 0000  Change dressing       Comments: Maintain surgical dressing until follow up in the clinic. If the edges start to pull up, may reinforce with tape. If the dressing is no longer working, may remove and cover with gauze and tape, but must keep the area dry and clean.  Call with any questions or concerns.   09/13/21 0731            Follow-up Information     Jacqueline Copas, PA-C. Go on 09/27/2021.   Specialty: Orthopedic Surgery Why: You are scheduled for a followup appointment  on 09-27-21 at 4:00 pm. Contact information: [address] [place] [address] [phone]                 Signed: Griffith Citron, PA-C Orthopedic Surgery 09/26/2021, 4:31 PM

## 2021-10-27 DIAGNOSIS — R051 Acute cough: Secondary | ICD-10-CM | POA: Diagnosis not present

## 2021-10-27 DIAGNOSIS — J069 Acute upper respiratory infection, unspecified: Secondary | ICD-10-CM | POA: Diagnosis not present

## 2021-10-30 DIAGNOSIS — D649 Anemia, unspecified: Secondary | ICD-10-CM | POA: Diagnosis not present

## 2021-11-01 DIAGNOSIS — Z1231 Encounter for screening mammogram for malignant neoplasm of breast: Secondary | ICD-10-CM | POA: Diagnosis not present

## 2021-11-03 ENCOUNTER — Encounter: Payer: Self-pay | Admitting: Obstetrics and Gynecology

## 2021-11-07 DIAGNOSIS — Z1389 Encounter for screening for other disorder: Secondary | ICD-10-CM | POA: Diagnosis not present

## 2021-11-07 DIAGNOSIS — Z0000 Encounter for general adult medical examination without abnormal findings: Secondary | ICD-10-CM | POA: Diagnosis not present

## 2021-11-08 DIAGNOSIS — Z96642 Presence of left artificial hip joint: Secondary | ICD-10-CM | POA: Diagnosis not present

## 2021-11-08 DIAGNOSIS — Z96643 Presence of artificial hip joint, bilateral: Secondary | ICD-10-CM | POA: Diagnosis not present

## 2021-11-08 DIAGNOSIS — M7062 Trochanteric bursitis, left hip: Secondary | ICD-10-CM | POA: Diagnosis not present

## 2021-11-08 DIAGNOSIS — Z471 Aftercare following joint replacement surgery: Secondary | ICD-10-CM | POA: Diagnosis not present

## 2021-11-08 DIAGNOSIS — Z96641 Presence of right artificial hip joint: Secondary | ICD-10-CM | POA: Diagnosis not present

## 2021-11-29 DIAGNOSIS — M069 Rheumatoid arthritis, unspecified: Secondary | ICD-10-CM | POA: Diagnosis not present

## 2021-11-29 DIAGNOSIS — R0982 Postnasal drip: Secondary | ICD-10-CM | POA: Diagnosis not present

## 2021-11-29 DIAGNOSIS — I10 Essential (primary) hypertension: Secondary | ICD-10-CM | POA: Diagnosis not present

## 2021-12-13 DIAGNOSIS — Z96643 Presence of artificial hip joint, bilateral: Secondary | ICD-10-CM | POA: Diagnosis not present

## 2021-12-13 DIAGNOSIS — M7062 Trochanteric bursitis, left hip: Secondary | ICD-10-CM | POA: Diagnosis not present

## 2021-12-13 DIAGNOSIS — Z96641 Presence of right artificial hip joint: Secondary | ICD-10-CM | POA: Diagnosis not present

## 2021-12-13 DIAGNOSIS — M5136 Other intervertebral disc degeneration, lumbar region: Secondary | ICD-10-CM | POA: Diagnosis not present

## 2021-12-13 DIAGNOSIS — Z471 Aftercare following joint replacement surgery: Secondary | ICD-10-CM | POA: Diagnosis not present

## 2021-12-13 DIAGNOSIS — M47816 Spondylosis without myelopathy or radiculopathy, lumbar region: Secondary | ICD-10-CM | POA: Diagnosis not present

## 2021-12-20 DIAGNOSIS — M810 Age-related osteoporosis without current pathological fracture: Secondary | ICD-10-CM | POA: Diagnosis not present

## 2021-12-26 DIAGNOSIS — E663 Overweight: Secondary | ICD-10-CM | POA: Diagnosis not present

## 2021-12-26 DIAGNOSIS — M0579 Rheumatoid arthritis with rheumatoid factor of multiple sites without organ or systems involvement: Secondary | ICD-10-CM | POA: Diagnosis not present

## 2021-12-26 DIAGNOSIS — M1991 Primary osteoarthritis, unspecified site: Secondary | ICD-10-CM | POA: Diagnosis not present

## 2021-12-26 DIAGNOSIS — Z6825 Body mass index (BMI) 25.0-25.9, adult: Secondary | ICD-10-CM | POA: Diagnosis not present

## 2021-12-26 DIAGNOSIS — M810 Age-related osteoporosis without current pathological fracture: Secondary | ICD-10-CM | POA: Diagnosis not present

## 2021-12-26 DIAGNOSIS — R5383 Other fatigue: Secondary | ICD-10-CM | POA: Diagnosis not present

## 2021-12-26 DIAGNOSIS — Z79899 Other long term (current) drug therapy: Secondary | ICD-10-CM | POA: Diagnosis not present

## 2022-01-01 DIAGNOSIS — M5451 Vertebrogenic low back pain: Secondary | ICD-10-CM | POA: Diagnosis not present

## 2022-01-01 DIAGNOSIS — M25552 Pain in left hip: Secondary | ICD-10-CM | POA: Diagnosis not present

## 2022-01-08 DIAGNOSIS — M25552 Pain in left hip: Secondary | ICD-10-CM | POA: Diagnosis not present

## 2022-01-08 DIAGNOSIS — M5451 Vertebrogenic low back pain: Secondary | ICD-10-CM | POA: Diagnosis not present

## 2022-01-10 DIAGNOSIS — M25552 Pain in left hip: Secondary | ICD-10-CM | POA: Diagnosis not present

## 2022-01-10 DIAGNOSIS — M5451 Vertebrogenic low back pain: Secondary | ICD-10-CM | POA: Diagnosis not present

## 2022-01-17 DIAGNOSIS — R5383 Other fatigue: Secondary | ICD-10-CM | POA: Diagnosis not present

## 2022-01-17 DIAGNOSIS — T50Z95A Adverse effect of other vaccines and biological substances, initial encounter: Secondary | ICD-10-CM | POA: Diagnosis not present

## 2022-01-17 DIAGNOSIS — R509 Fever, unspecified: Secondary | ICD-10-CM | POA: Diagnosis not present

## 2022-01-17 DIAGNOSIS — M5451 Vertebrogenic low back pain: Secondary | ICD-10-CM | POA: Diagnosis not present

## 2022-01-17 DIAGNOSIS — M25552 Pain in left hip: Secondary | ICD-10-CM | POA: Diagnosis not present

## 2022-01-19 DIAGNOSIS — M25552 Pain in left hip: Secondary | ICD-10-CM | POA: Diagnosis not present

## 2022-01-19 DIAGNOSIS — M5451 Vertebrogenic low back pain: Secondary | ICD-10-CM | POA: Diagnosis not present

## 2022-01-23 DIAGNOSIS — M25552 Pain in left hip: Secondary | ICD-10-CM | POA: Diagnosis not present

## 2022-01-23 DIAGNOSIS — M5451 Vertebrogenic low back pain: Secondary | ICD-10-CM | POA: Diagnosis not present

## 2022-02-02 DIAGNOSIS — M5451 Vertebrogenic low back pain: Secondary | ICD-10-CM | POA: Diagnosis not present

## 2022-02-02 DIAGNOSIS — M25552 Pain in left hip: Secondary | ICD-10-CM | POA: Diagnosis not present

## 2022-02-06 DIAGNOSIS — M25552 Pain in left hip: Secondary | ICD-10-CM | POA: Diagnosis not present

## 2022-02-09 DIAGNOSIS — M5451 Vertebrogenic low back pain: Secondary | ICD-10-CM | POA: Diagnosis not present

## 2022-02-09 DIAGNOSIS — M25552 Pain in left hip: Secondary | ICD-10-CM | POA: Diagnosis not present

## 2022-02-14 DIAGNOSIS — M25552 Pain in left hip: Secondary | ICD-10-CM | POA: Diagnosis not present

## 2022-02-14 DIAGNOSIS — M5451 Vertebrogenic low back pain: Secondary | ICD-10-CM | POA: Diagnosis not present

## 2022-02-20 DIAGNOSIS — M5451 Vertebrogenic low back pain: Secondary | ICD-10-CM | POA: Diagnosis not present

## 2022-02-20 DIAGNOSIS — M25552 Pain in left hip: Secondary | ICD-10-CM | POA: Diagnosis not present

## 2022-02-28 DIAGNOSIS — M25552 Pain in left hip: Secondary | ICD-10-CM | POA: Diagnosis not present

## 2022-02-28 DIAGNOSIS — M5451 Vertebrogenic low back pain: Secondary | ICD-10-CM | POA: Diagnosis not present

## 2022-03-08 DIAGNOSIS — M5451 Vertebrogenic low back pain: Secondary | ICD-10-CM | POA: Diagnosis not present

## 2022-03-08 DIAGNOSIS — M25552 Pain in left hip: Secondary | ICD-10-CM | POA: Diagnosis not present

## 2022-03-16 DIAGNOSIS — R053 Chronic cough: Secondary | ICD-10-CM | POA: Diagnosis not present

## 2022-03-16 DIAGNOSIS — I10 Essential (primary) hypertension: Secondary | ICD-10-CM | POA: Diagnosis not present

## 2022-03-16 DIAGNOSIS — T464X5A Adverse effect of angiotensin-converting-enzyme inhibitors, initial encounter: Secondary | ICD-10-CM | POA: Diagnosis not present

## 2022-03-28 DIAGNOSIS — M0579 Rheumatoid arthritis with rheumatoid factor of multiple sites without organ or systems involvement: Secondary | ICD-10-CM | POA: Diagnosis not present

## 2022-06-26 DIAGNOSIS — M810 Age-related osteoporosis without current pathological fracture: Secondary | ICD-10-CM | POA: Diagnosis not present

## 2022-07-04 DIAGNOSIS — Z6825 Body mass index (BMI) 25.0-25.9, adult: Secondary | ICD-10-CM | POA: Diagnosis not present

## 2022-07-04 DIAGNOSIS — I7300 Raynaud's syndrome without gangrene: Secondary | ICD-10-CM | POA: Diagnosis not present

## 2022-07-04 DIAGNOSIS — M0579 Rheumatoid arthritis with rheumatoid factor of multiple sites without organ or systems involvement: Secondary | ICD-10-CM | POA: Diagnosis not present

## 2022-07-04 DIAGNOSIS — M810 Age-related osteoporosis without current pathological fracture: Secondary | ICD-10-CM | POA: Diagnosis not present

## 2022-07-04 DIAGNOSIS — M1991 Primary osteoarthritis, unspecified site: Secondary | ICD-10-CM | POA: Diagnosis not present

## 2022-07-04 DIAGNOSIS — Z79899 Other long term (current) drug therapy: Secondary | ICD-10-CM | POA: Diagnosis not present

## 2022-07-04 DIAGNOSIS — E663 Overweight: Secondary | ICD-10-CM | POA: Diagnosis not present

## 2022-07-24 ENCOUNTER — Ambulatory Visit: Payer: Medicare PPO | Admitting: Obstetrics and Gynecology

## 2022-07-26 DIAGNOSIS — H93291 Other abnormal auditory perceptions, right ear: Secondary | ICD-10-CM | POA: Diagnosis not present

## 2022-07-26 DIAGNOSIS — H9311 Tinnitus, right ear: Secondary | ICD-10-CM | POA: Insufficient documentation

## 2022-08-27 DIAGNOSIS — D044 Carcinoma in situ of skin of scalp and neck: Secondary | ICD-10-CM | POA: Diagnosis not present

## 2022-08-27 DIAGNOSIS — L280 Lichen simplex chronicus: Secondary | ICD-10-CM | POA: Diagnosis not present

## 2022-08-27 DIAGNOSIS — D485 Neoplasm of uncertain behavior of skin: Secondary | ICD-10-CM | POA: Diagnosis not present

## 2022-08-27 DIAGNOSIS — L905 Scar conditions and fibrosis of skin: Secondary | ICD-10-CM | POA: Diagnosis not present

## 2022-08-27 DIAGNOSIS — L570 Actinic keratosis: Secondary | ICD-10-CM | POA: Diagnosis not present

## 2022-10-03 DIAGNOSIS — M0579 Rheumatoid arthritis with rheumatoid factor of multiple sites without organ or systems involvement: Secondary | ICD-10-CM | POA: Diagnosis not present

## 2022-10-07 DIAGNOSIS — U071 COVID-19: Secondary | ICD-10-CM | POA: Diagnosis not present

## 2022-10-07 DIAGNOSIS — M069 Rheumatoid arthritis, unspecified: Secondary | ICD-10-CM | POA: Diagnosis not present

## 2022-11-07 DIAGNOSIS — Z1231 Encounter for screening mammogram for malignant neoplasm of breast: Secondary | ICD-10-CM | POA: Diagnosis not present

## 2022-11-08 ENCOUNTER — Encounter: Payer: Self-pay | Admitting: Obstetrics and Gynecology

## 2022-11-27 DIAGNOSIS — L570 Actinic keratosis: Secondary | ICD-10-CM | POA: Diagnosis not present

## 2022-11-27 DIAGNOSIS — Z85828 Personal history of other malignant neoplasm of skin: Secondary | ICD-10-CM | POA: Diagnosis not present

## 2022-11-27 DIAGNOSIS — L280 Lichen simplex chronicus: Secondary | ICD-10-CM | POA: Diagnosis not present

## 2022-12-17 DIAGNOSIS — M069 Rheumatoid arthritis, unspecified: Secondary | ICD-10-CM | POA: Diagnosis not present

## 2022-12-17 DIAGNOSIS — Z13220 Encounter for screening for lipoid disorders: Secondary | ICD-10-CM | POA: Diagnosis not present

## 2022-12-17 DIAGNOSIS — Z1211 Encounter for screening for malignant neoplasm of colon: Secondary | ICD-10-CM | POA: Diagnosis not present

## 2022-12-17 DIAGNOSIS — M810 Age-related osteoporosis without current pathological fracture: Secondary | ICD-10-CM | POA: Diagnosis not present

## 2022-12-17 DIAGNOSIS — R350 Frequency of micturition: Secondary | ICD-10-CM | POA: Diagnosis not present

## 2022-12-17 DIAGNOSIS — Z0000 Encounter for general adult medical examination without abnormal findings: Secondary | ICD-10-CM | POA: Diagnosis not present

## 2022-12-17 DIAGNOSIS — I10 Essential (primary) hypertension: Secondary | ICD-10-CM | POA: Diagnosis not present

## 2022-12-17 DIAGNOSIS — Z79899 Other long term (current) drug therapy: Secondary | ICD-10-CM | POA: Diagnosis not present

## 2022-12-17 DIAGNOSIS — D84821 Immunodeficiency due to drugs: Secondary | ICD-10-CM | POA: Diagnosis not present

## 2022-12-19 DIAGNOSIS — H43813 Vitreous degeneration, bilateral: Secondary | ICD-10-CM | POA: Diagnosis not present

## 2022-12-19 DIAGNOSIS — H52203 Unspecified astigmatism, bilateral: Secondary | ICD-10-CM | POA: Diagnosis not present

## 2022-12-19 DIAGNOSIS — H2513 Age-related nuclear cataract, bilateral: Secondary | ICD-10-CM | POA: Diagnosis not present

## 2022-12-19 DIAGNOSIS — H0100A Unspecified blepharitis right eye, upper and lower eyelids: Secondary | ICD-10-CM | POA: Diagnosis not present

## 2022-12-19 DIAGNOSIS — H524 Presbyopia: Secondary | ICD-10-CM | POA: Diagnosis not present

## 2022-12-19 DIAGNOSIS — H5203 Hypermetropia, bilateral: Secondary | ICD-10-CM | POA: Diagnosis not present

## 2022-12-19 DIAGNOSIS — H04123 Dry eye syndrome of bilateral lacrimal glands: Secondary | ICD-10-CM | POA: Diagnosis not present

## 2022-12-19 DIAGNOSIS — H35371 Puckering of macula, right eye: Secondary | ICD-10-CM | POA: Diagnosis not present

## 2023-01-02 DIAGNOSIS — R5383 Other fatigue: Secondary | ICD-10-CM | POA: Diagnosis not present

## 2023-01-02 DIAGNOSIS — Z6825 Body mass index (BMI) 25.0-25.9, adult: Secondary | ICD-10-CM | POA: Diagnosis not present

## 2023-01-02 DIAGNOSIS — Z79899 Other long term (current) drug therapy: Secondary | ICD-10-CM | POA: Diagnosis not present

## 2023-01-02 DIAGNOSIS — M810 Age-related osteoporosis without current pathological fracture: Secondary | ICD-10-CM | POA: Diagnosis not present

## 2023-01-02 DIAGNOSIS — M1991 Primary osteoarthritis, unspecified site: Secondary | ICD-10-CM | POA: Diagnosis not present

## 2023-01-02 DIAGNOSIS — M0579 Rheumatoid arthritis with rheumatoid factor of multiple sites without organ or systems involvement: Secondary | ICD-10-CM | POA: Diagnosis not present

## 2023-01-02 DIAGNOSIS — E663 Overweight: Secondary | ICD-10-CM | POA: Diagnosis not present

## 2023-01-07 ENCOUNTER — Telehealth: Payer: Self-pay | Admitting: Gastroenterology

## 2023-01-07 NOTE — Telephone Encounter (Signed)
Hi Dr. Fuller Plan,   We received a referral for patient to have another colonoscopy done, the patient has GI history with Dr. Collene Mares is requesting specifically for yo to continue her GI care because her husband is your patient and she would like to have the same provider. Her records were obtained for you to review and advise on scheduling.    Thanks

## 2023-01-09 ENCOUNTER — Encounter: Payer: Self-pay | Admitting: Gastroenterology

## 2023-01-09 DIAGNOSIS — M810 Age-related osteoporosis without current pathological fracture: Secondary | ICD-10-CM | POA: Diagnosis not present

## 2023-01-09 DIAGNOSIS — Z780 Asymptomatic menopausal state: Secondary | ICD-10-CM | POA: Diagnosis not present

## 2023-01-09 DIAGNOSIS — M85831 Other specified disorders of bone density and structure, right forearm: Secondary | ICD-10-CM | POA: Diagnosis not present

## 2023-01-10 ENCOUNTER — Encounter: Payer: Self-pay | Admitting: Obstetrics and Gynecology

## 2023-01-14 DIAGNOSIS — M810 Age-related osteoporosis without current pathological fracture: Secondary | ICD-10-CM | POA: Diagnosis not present

## 2023-01-14 DIAGNOSIS — Z79899 Other long term (current) drug therapy: Secondary | ICD-10-CM | POA: Diagnosis not present

## 2023-01-14 DIAGNOSIS — M069 Rheumatoid arthritis, unspecified: Secondary | ICD-10-CM | POA: Diagnosis not present

## 2023-01-14 DIAGNOSIS — D84821 Immunodeficiency due to drugs: Secondary | ICD-10-CM | POA: Diagnosis not present

## 2023-01-14 DIAGNOSIS — I10 Essential (primary) hypertension: Secondary | ICD-10-CM | POA: Diagnosis not present

## 2023-01-17 DIAGNOSIS — M810 Age-related osteoporosis without current pathological fracture: Secondary | ICD-10-CM | POA: Diagnosis not present

## 2023-01-20 IMAGING — DX DG PORTABLE PELVIS
1 series · 1 of 1 positions shown · non-contrast
Comparison: Same-day intraoperative radiographs, pelvis radiographs
06/28/2020

CLINICAL DATA: Right hip arthroplasty

EXAM:
PORTABLE PELVIS 1-2 VIEWS

[pelvis ap]
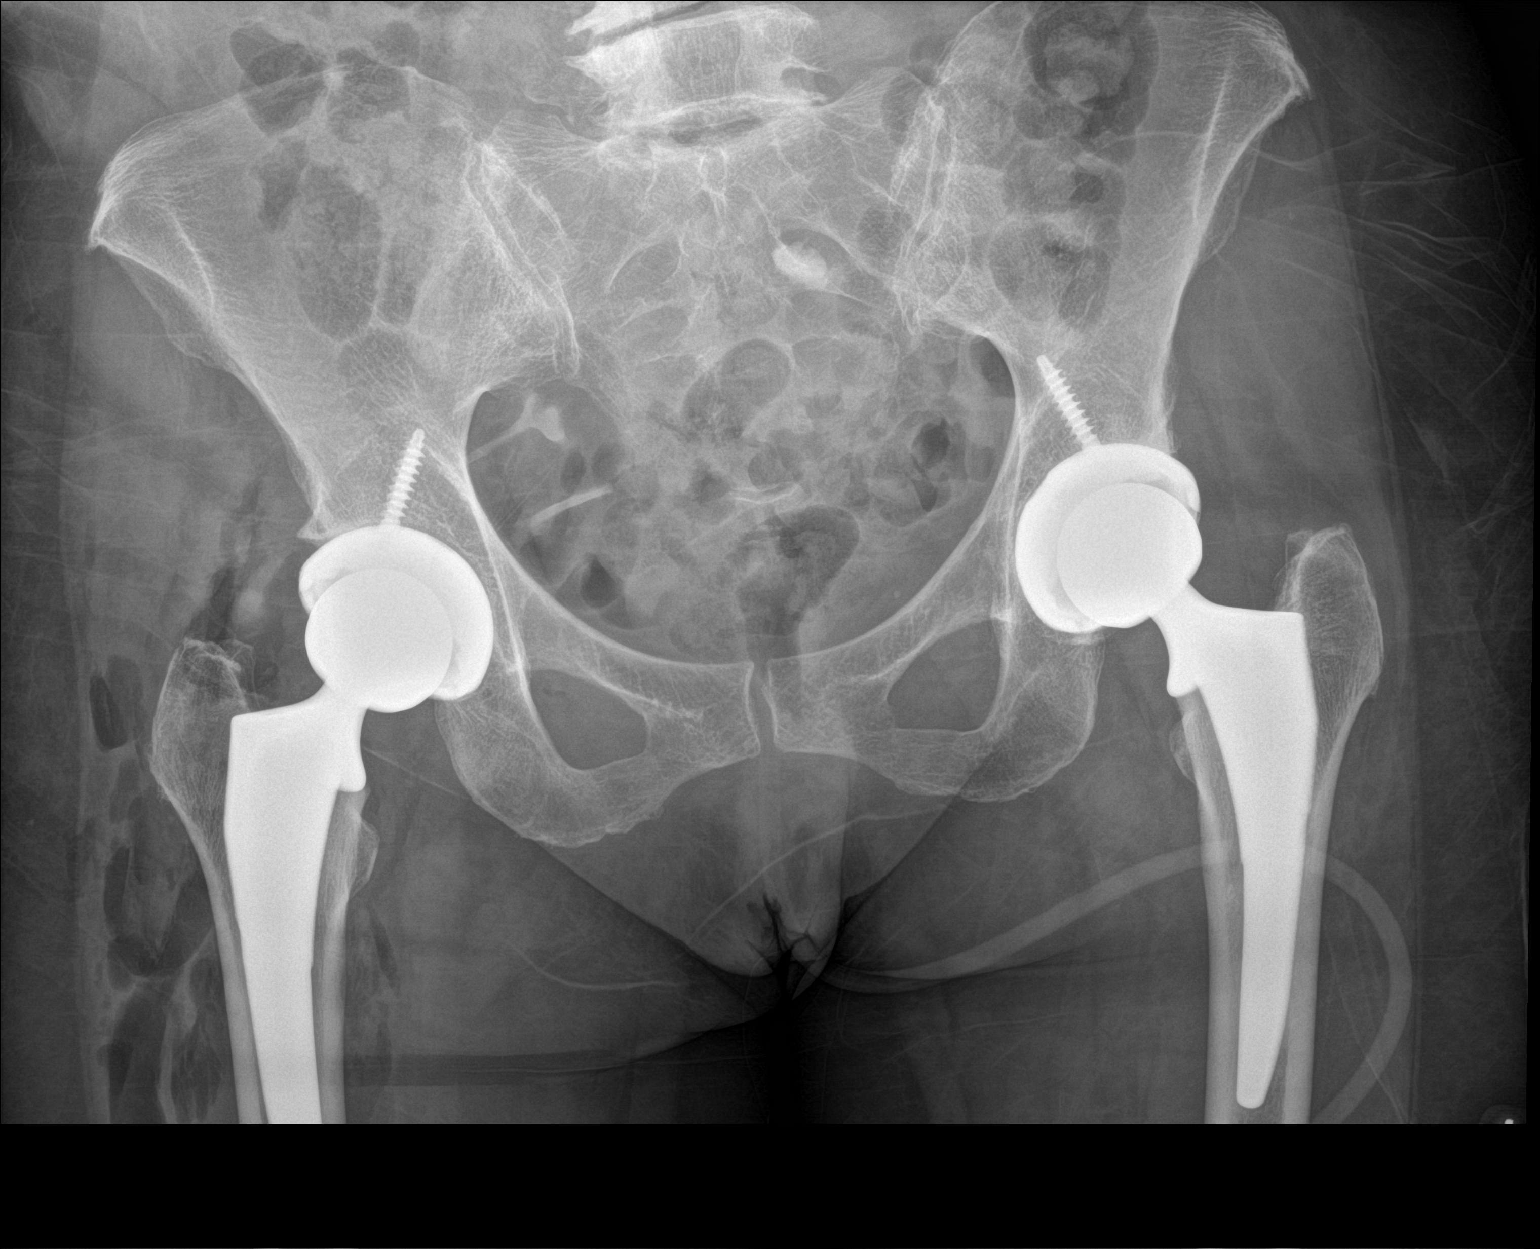

[1 of 1 positions shown; findings below may reference images not displayed]

FINDINGS: There has been interval right hip arthroplasty. Hardware alignment
is within expected limits, without evidence of hardware related
complication. There is expected postoperative gas about the right
hip. Left hip arthroplasty hardware is stable, without evidence of
complication. The SI joints and symphysis pubis are intact.
IMPRESSION: Status post right hip arthroplasty without evidence of complication

## 2023-01-20 IMAGING — XA DG HIP (WITH OR WITHOUT PELVIS) 1V*R*
1 series · 2 of 2 positions shown · non-contrast
Comparison: Pelvis radiographs 06/28/2020

CLINICAL DATA: Right hip replacement

EXAM:
DG HIP (WITH OR WITHOUT PELVIS) 1V RIGHT

[Series 1: unknown protocol · 2 of 2 slices shown]
[im 1/2]
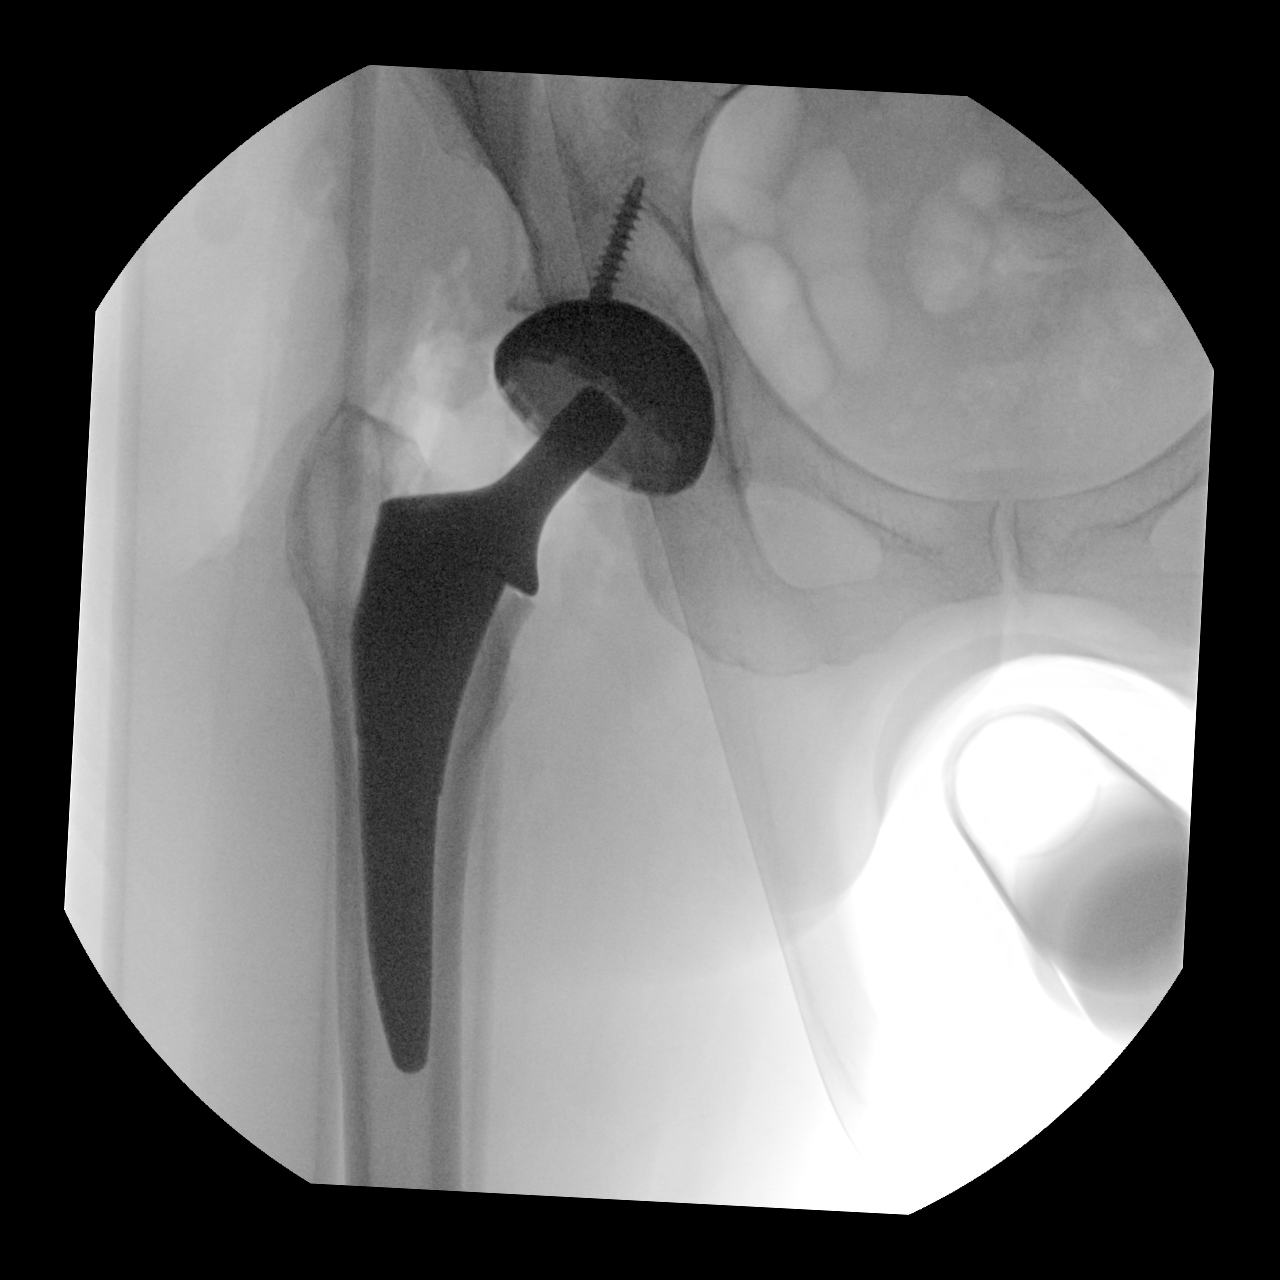
[im 2/2]
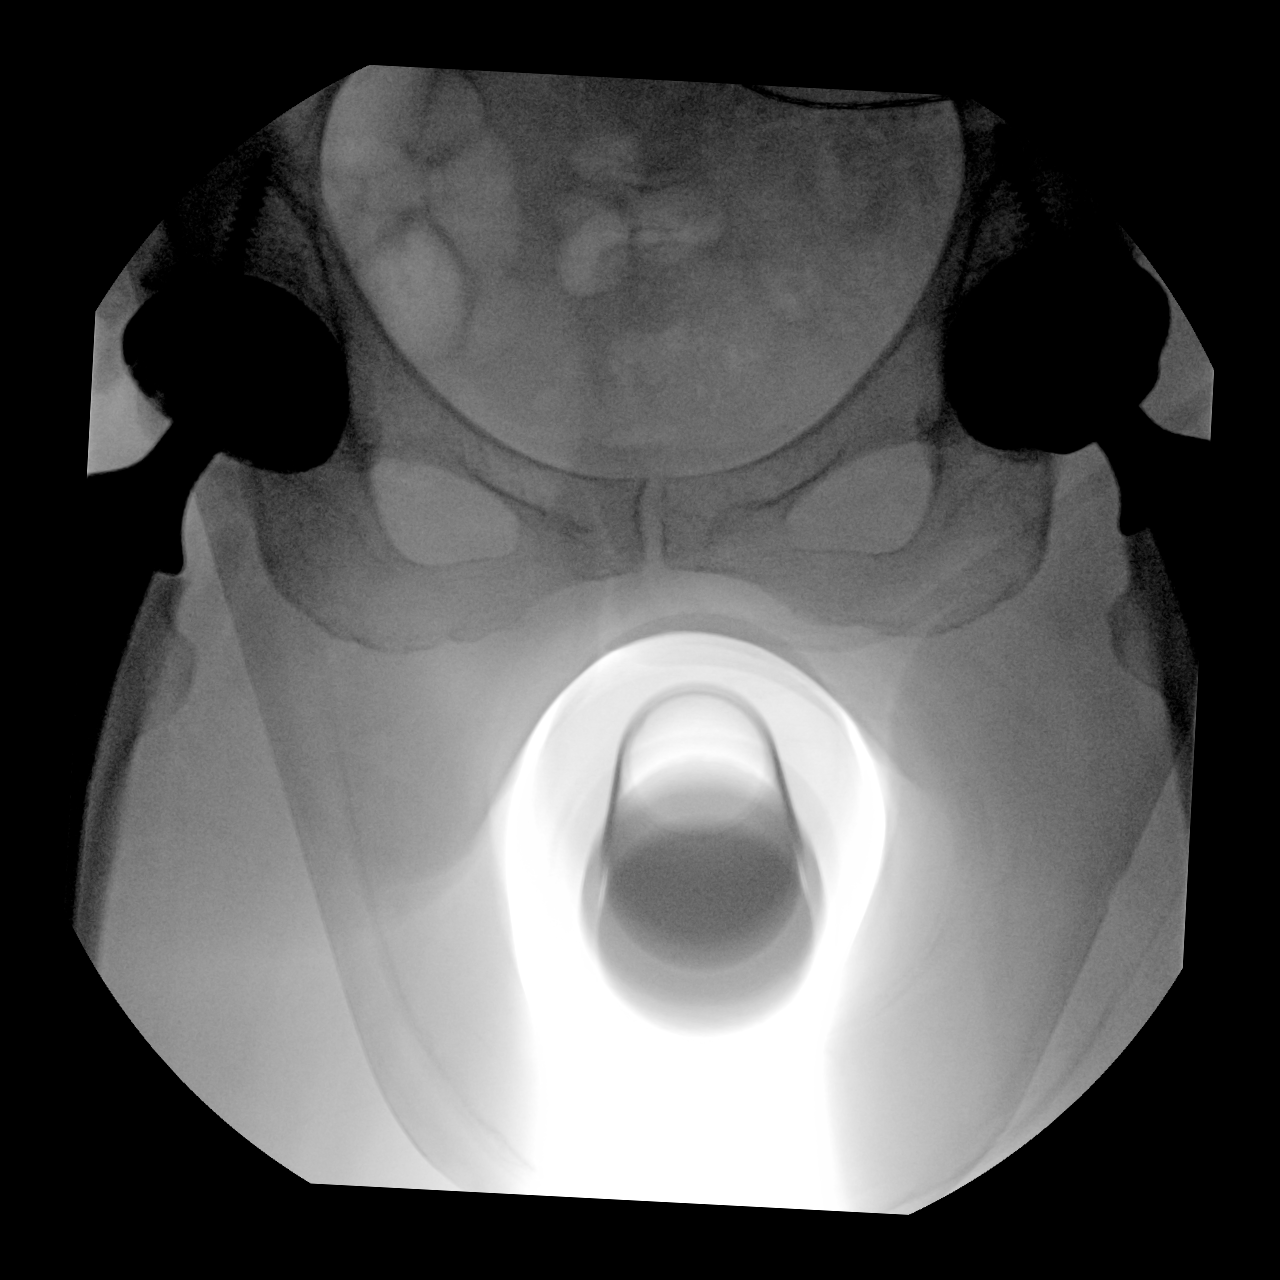

[2 of 2 positions shown; findings below may reference images not displayed]

FINDINGS: Two C-arm fluoroscopic images were obtained intraoperatively and
submitted for post operative interpretation. The patient is status
post right hip arthroplasty. Hardware alignment is within expected
limits, without evidence of complication. The symphysis pubis is
intact. Postsurgical changes on the left are also noted,
incompletely imaged. Fluoro time 7 seconds please see the performing
provider's procedural report for further detail.
IMPRESSION: Status post right hip arthroplasty without evidence of immediate
complication.

## 2023-01-22 DIAGNOSIS — D0461 Carcinoma in situ of skin of right upper limb, including shoulder: Secondary | ICD-10-CM | POA: Diagnosis not present

## 2023-01-22 DIAGNOSIS — D485 Neoplasm of uncertain behavior of skin: Secondary | ICD-10-CM | POA: Diagnosis not present

## 2023-01-22 DIAGNOSIS — L570 Actinic keratosis: Secondary | ICD-10-CM | POA: Diagnosis not present

## 2023-01-23 ENCOUNTER — Ambulatory Visit (AMBULATORY_SURGERY_CENTER): Payer: Medicare PPO | Admitting: *Deleted

## 2023-01-23 VITALS — Ht 64.5 in | Wt 150.0 lb

## 2023-01-23 DIAGNOSIS — M5416 Radiculopathy, lumbar region: Secondary | ICD-10-CM | POA: Diagnosis not present

## 2023-01-23 DIAGNOSIS — Z96643 Presence of artificial hip joint, bilateral: Secondary | ICD-10-CM | POA: Diagnosis not present

## 2023-01-23 DIAGNOSIS — Z83719 Family history of colon polyps, unspecified: Secondary | ICD-10-CM

## 2023-01-23 MED ORDER — NA SULFATE-K SULFATE-MG SULF 17.5-3.13-1.6 GM/177ML PO SOLN: 1.0000 | Freq: Once | ORAL | 0 refills | Status: AC

## 2023-01-23 MED ORDER — ONDANSETRON HCL 4 MG PO TABS: ORAL_TABLET | ORAL | 0 refills | Status: DC

## 2023-01-23 NOTE — Progress Notes (Signed)
No egg or soy allergy known to patient  No issues known to pt with past sedation with any surgeries or procedures Patient denies ever being told they had issues or difficulty with intubation  No FH of Malignant Hyperthermia Pt is not on diet pills Pt is not on  home 02  Pt is not on blood thinners  Pt denies issues with constipation  No A fib or A flutter Have any cardiac testing pending--NO Pt instructed to use Singlecare.com or GoodRx for a price reduction on prep    Patient's chart reviewed by Osvaldo Angst CNRA prior to previsit and patient appropriate for the [place].  Previsit completed and red dot placed by patient's name on their procedure day (on provider's schedule).     ZOFRAN GIVEN TO TAKE WITH PREP D/T PT.CONCERNS OF BEING SICK.PT.WANTED RX TO BE SENT TO PHARMACY ON FILE EVEN AFTER TELLING ABOUT COUPON SAVINGS.

## 2023-02-17 ENCOUNTER — Encounter: Payer: Self-pay | Admitting: Certified Registered Nurse Anesthetist

## 2023-02-20 ENCOUNTER — Encounter: Payer: Self-pay | Admitting: Gastroenterology

## 2023-02-20 ENCOUNTER — Ambulatory Visit (AMBULATORY_SURGERY_CENTER): Payer: Medicare PPO | Admitting: Gastroenterology

## 2023-02-20 VITALS — BP 115/66 | HR 65 | Temp 98.6°F | Resp 13 | Ht 64.0 in | Wt 150.0 lb

## 2023-02-20 DIAGNOSIS — D124 Benign neoplasm of descending colon: Secondary | ICD-10-CM

## 2023-02-20 DIAGNOSIS — D122 Benign neoplasm of ascending colon: Secondary | ICD-10-CM | POA: Diagnosis not present

## 2023-02-20 DIAGNOSIS — Z83719 Family history of colon polyps, unspecified: Secondary | ICD-10-CM | POA: Diagnosis not present

## 2023-02-20 DIAGNOSIS — D120 Benign neoplasm of cecum: Secondary | ICD-10-CM

## 2023-02-20 DIAGNOSIS — Z1211 Encounter for screening for malignant neoplasm of colon: Secondary | ICD-10-CM

## 2023-02-20 DIAGNOSIS — I10 Essential (primary) hypertension: Secondary | ICD-10-CM | POA: Diagnosis not present

## 2023-02-20 MED ORDER — SODIUM CHLORIDE 0.9 % IV SOLN: 500.0000 mL | Freq: Once | INTRAVENOUS | Status: DC

## 2023-02-20 NOTE — Progress Notes (Signed)
Vitals-DT  Pt's states no medical or surgical changes since previsit or office visit.  

## 2023-02-20 NOTE — Progress Notes (Signed)
Called to room to assist during endoscopic procedure.  Patient ID and intended procedure confirmed with present staff. Received instructions for my participation in the procedure from the performing physician.  

## 2023-02-20 NOTE — Op Note (Signed)
[facility] Patient Name: Jacqueline Tucker Procedure Date: 02/20/2023 11:54 AM MRN: [id number] Endoscopist: Meryl Dare , MD, [phone] Age: 72 Referring MD:  Date of Birth: 1951-05-17 Gender: Female Account #: [id number] Procedure:                Colonoscopy Indications:              Colon cancer screening in patient at increased                            risk: Family history of 1st-degree relative with                            colon polyps Medicines:                Monitored Anesthesia Care Procedure:                Pre-Anesthesia Assessment:                           - Prior to the procedure, a History and Physical                            was performed, and patient medications and                            allergies were reviewed. The patient's tolerance of                            previous anesthesia was also reviewed. The risks                            and benefits of the procedure and the sedation                            options and risks were discussed with the patient.                            All questions were answered, and informed consent                            was obtained. Prior Anticoagulants: The patient has                            taken no anticoagulant or antiplatelet agents. ASA                            Grade Assessment: II - A patient with mild systemic                            disease. After reviewing the risks and benefits,                            the patient was deemed in satisfactory condition to  undergo the procedure.                           After obtaining informed consent, the colonoscope                            was passed under direct vision. Throughout the                            procedure, the patient's blood pressure, pulse, and                            oxygen saturations were monitored continuously. The                            PCF-HQ190L Colonoscope [id number] was introduced                             through the anus and advanced to the the cecum,                            identified by appendiceal orifice and ileocecal                            valve. The ileocecal valve, appendiceal orifice,                            and rectum were photographed. The quality of the                            bowel preparation was good. The colonoscopy was                            performed without difficulty. The patient tolerated                            the procedure well. Scope In: 11:58:31 AM Scope Out: 12:18:28 PM Scope Withdrawal Time: 0 hours 15 minutes 20 seconds  Total Procedure Duration: 0 hours 19 minutes 57 seconds  Findings:                 The perianal and digital rectal examinations were                            normal.                           A 6 mm polyp was found in the descending colon. The                            polyp was sessile. The polyp was removed with a                            cold snare. Resection was complete, but the polyp  tissue was not retrieved.                           Two sessile polyps were found in the ascending                            colon and cecum. The polyps were 3 to 4 mm in size.                            These polyps were removed with a cold biopsy                            forceps. Resection and retrieval were complete.                           A few small-mouthed diverticula were found in the                            left colon. There was no evidence of diverticular                            bleeding.                           The exam was otherwise without abnormality on                            direct and retroflexion views. Complications:            No immediate complications. Estimated blood loss:                            None. Estimated Blood Loss:     Estimated blood loss: none. Impression:               - One 6 mm polyp in the descending colon, removed                             with a cold snare. Complete resection. Polyp tissue                            not retrieved.                           - Two 3 to 4 mm polyps in the ascending colon and                            in the cecum, removed with a cold snare. Resected                            and retrieved.                           - Mild diverticulosis in the left colon.                           -  The examination was otherwise normal on direct                            and retroflexion views. Recommendation:           - Repeat colonoscopy vs no repeat due to age after                            studies are complete for surveillance based on                            pathology results.                           - Patient has a contact number available for                            emergencies. The signs and symptoms of potential                            delayed complications were discussed with the                            patient. Return to normal activities tomorrow.                            Written discharge instructions were provided to the                            patient.                           - Resume previous diet.                           - Continue present medications.                           - Await pathology results. Meryl Dare, MD 02/20/2023 12:24:31 PM This report has been signed electronically.

## 2023-02-20 NOTE — Progress Notes (Signed)
Report given to PACU, vss 

## 2023-02-20 NOTE — Progress Notes (Signed)
History & Physical  Primary Care Physician:  Clayborn Heron, MD Primary Gastroenterologist: Claudette Head, MD  Impression / Plan:  [facility] for colonoscopy  CHIEF COMPLAINT:  Family history of colon polyps   HPI: Jacqueline Tucker is a 72 y.o. female with a family history of colon polyps for colonoscopy.    Past Medical History:  Diagnosis Date   Abnormal Pap smear of cervix 07/1998   LGSIL   Allergy    SEASONAL   Arthritis    Cancer (HCC)    SQUAMOUS CELL   Cataract    BEGINNING   Complication of anesthesia    Headache   GERD (gastroesophageal reflux disease)    Headache(784.0)    migraines   Hypertension    Osteoporosis    Pneumonia    PONV (postoperative nausea and vomiting)    Rheumatoid arthritis (HCC)     Past Surgical History:  Procedure Laterality Date   CRYOTHERAPY  1984   dysplasia   DECOMPRESSIVE LUMBAR LAMINECTOMY LEVEL 2 N/A 11/11/2013   Procedure: DECOMPRESSIVE LUMBAR LAMINECTOMY L3-L5;  Surgeon: Venita Lick, MD;  Location: [facility] OR;  Service: Orthopedics;  Laterality: N/A;   DILATION AND CURETTAGE OF UTERUS  1990   SAB   LUMBAR LAMINECTOMY  11/11/2013   L3   L5     DR BROOKS   SKIN CANCER EXCISION     Hand   TOTAL HIP ARTHROPLASTY Left 06/28/2020   Procedure: TOTAL HIP ARTHROPLASTY ANTERIOR APPROACH;  Surgeon: Durene Romans, MD;  Location: [facility] ORS;  Service: Orthopedics;  Laterality: Left;  70 mins   TOTAL HIP ARTHROPLASTY Right 09/12/2021   Procedure: TOTAL HIP ARTHROPLASTY ANTERIOR APPROACH;  Surgeon: Durene Romans, MD;  Location: [facility] ORS;  Service: Orthopedics;  Laterality: Right;   WISDOM TOOTH EXTRACTION  1968    Prior to Admission medications   Medication Sig Start Date End Date Taking? Authorizing Provider  amLODipine (NORVASC) 2.5 MG tablet Take 2.5 mg by mouth daily. 01/14/23  Yes [provider]  amLODipine (NORVASC) 5 MG tablet 1 tablet Orally Once a day for 30 day(s) 07/26/22  Yes [provider]  Biotin 1000 MCG tablet  Take 1,000 mcg by mouth daily.   Yes [provider]  Calcium Carb-Cholecalciferol (CALCIUM 600 + D PO) Take 1 tablet by mouth every other day.   Yes [provider]  Cholecalciferol (VITAMIN D) 50 MCG (2000 UT) CAPS Take 2,000 Units by mouth daily.   Yes [provider]  folic acid (FOLVITE) 1 MG tablet Take 2 mg by mouth daily.   Yes [provider]  MAGNESIUM PO Take 250 mg by mouth daily.   Yes [provider]  methotrexate (RHEUMATREX) 2.5 MG tablet Take 20 mg by mouth every Sunday. Caution:Chemotherapy. Protect from light.   Yes [provider]  Multiple Vitamin (MULTIVITAMIN ADULT PO) Take by mouth. TAKE EVERY OTHER DAY   Yes [provider]  Upadacitinib ER (RINVOQ) 15 MG TB24 Take by mouth daily.   Yes [provider]  acetaminophen (TYLENOL) 500 MG tablet Take 2 tablets (1,000 mg total) by mouth every 6 (six) hours as needed for moderate pain. Do not take if using Hydrocodone consistently, which does contain Tylenol 09/13/21   Cassandria Anger, PA-C  Carboxymethylcellul-Glycerin (LUBRICATING EYE DROPS OP) Place 1 drop into both eyes daily as needed (dry eyes).    [provider]  celecoxib (CELEBREX) 200 MG capsule Take 200 mg by mouth 2 (two) times daily.  12/31/17   [provider]  denosumab (PROLIA) 60 MG/ML SOLN injection Inject 60 mg into the skin every 6 (six) months. Administer in upper arm, thigh, or abdomen    [provider]  docusate sodium (COLACE) 100 MG capsule Take 1 capsule (100 mg total) by mouth 2 (two) times daily. 09/13/21   Cassandria Anger, PA-C  gabapentin (NEURONTIN) 100 MG capsule Take 100-200 mg by mouth See admin instructions. Take 100 mg in the morning and 200 mg at night    [provider]  lisinopril (ZESTRIL) 20 MG tablet Take 40 mg by mouth daily.    [provider]  methocarbamol (ROBAXIN) 500 MG tablet Take 1 tablet (500 mg total) by mouth  every 6 (six) hours as needed for muscle spasms. 09/13/21   Cassandria Anger, PA-C  Multiple Vitamin (MULTIVITAMIN WITH MINERALS) TABS tablet Take 1 tablet by mouth every other day.     [provider]  ondansetron (ZOFRAN) 4 MG tablet TAKE 30-60 MINUTES PRIOR TO EACH PREP DOSE 01/23/23   Meryl Dare, MD  polyethylene glycol (MIRALAX / GLYCOLAX) 17 g packet Take 17 g by mouth daily as needed for mild constipation. 09/13/21   Cassandria Anger, PA-C    Current Outpatient Medications  Medication Sig Dispense Refill   amLODipine (NORVASC) 2.5 MG tablet Take 2.5 mg by mouth daily.     amLODipine (NORVASC) 5 MG tablet 1 tablet Orally Once a day for 30 day(s)     Biotin 1000 MCG tablet Take 1,000 mcg by mouth daily.     Calcium Carb-Cholecalciferol (CALCIUM 600 + D PO) Take 1 tablet by mouth every other day.     Cholecalciferol (VITAMIN D) 50 MCG (2000 UT) CAPS Take 2,000 Units by mouth daily.     folic acid (FOLVITE) 1 MG tablet Take 2 mg by mouth daily.     MAGNESIUM PO Take 250 mg by mouth daily.     methotrexate (RHEUMATREX) 2.5 MG tablet Take 20 mg by mouth every Sunday. Caution:Chemotherapy. Protect from light.     Multiple Vitamin (MULTIVITAMIN ADULT PO) Take by mouth. TAKE EVERY OTHER DAY     Upadacitinib ER (RINVOQ) 15 MG TB24 Take by mouth daily.     acetaminophen (TYLENOL) 500 MG tablet Take 2 tablets (1,000 mg total) by mouth every 6 (six) hours as needed for moderate pain. Do not take if using Hydrocodone consistently, which does contain Tylenol 30 tablet 0   Carboxymethylcellul-Glycerin (LUBRICATING EYE DROPS OP) Place 1 drop into both eyes daily as needed (dry eyes).     celecoxib (CELEBREX) 200 MG capsule Take 200 mg by mouth 2 (two) times daily.      denosumab (PROLIA) 60 MG/ML SOLN injection Inject 60 mg into the skin every 6 (six) months. Administer in upper arm, thigh, or abdomen     docusate sodium (COLACE) 100 MG capsule Take 1 capsule (100 mg total) by mouth 2  (two) times daily. 10 capsule 0   gabapentin (NEURONTIN) 100 MG capsule Take 100-200 mg by mouth See admin instructions. Take 100 mg in the morning and 200 mg at night     lisinopril (ZESTRIL) 20 MG tablet Take 40 mg by mouth daily.     methocarbamol (ROBAXIN) 500 MG tablet Take 1 tablet (500 mg total) by mouth every 6 (six) hours as needed for muscle spasms. 40 tablet 0   Multiple Vitamin (MULTIVITAMIN WITH MINERALS) TABS tablet Take 1 tablet by mouth every other  day.      ondansetron (ZOFRAN) 4 MG tablet TAKE 30-60 MINUTES PRIOR TO EACH PREP DOSE 4 tablet 0   polyethylene glycol (MIRALAX / GLYCOLAX) 17 g packet Take 17 g by mouth daily as needed for mild constipation. 14 each 0   Current Facility-Administered Medications  Medication Dose Route Frequency Provider Last Rate Last Admin   0.9 %  sodium chloride infusion  500 mL Intravenous Once Meryl Dare, MD        Allergies as of 02/20/2023 - Review Complete 02/20/2023  Allergen Reaction Noted   Codeine  07/22/2014   Alendronate  01/10/2023   Bacitracin-polymyxin b Itching 01/10/2023   Latex Itching 11/04/2013   Lisinopril Cough 01/10/2023   Neosporin [neomycin-bacitracin zn-polymyx] Rash 01/16/2018    Family History  Problem Relation Age of Onset   Breast cancer Mother 51   Hypertension Mother    Thyroid disease Mother    Cancer Mother        ocular melanoma   Colon polyps Father    Cancer Father        prostate   Hypertension Father    Thyroid disease Father    Heart attack Maternal Grandmother    Cancer Maternal Grandfather        blood   Stomach cancer Paternal Grandmother    Cancer Paternal Grandmother        stomach   Colon cancer Neg Hx    Crohn's disease Neg Hx    Esophageal cancer Neg Hx    Rectal cancer Neg Hx    Ulcerative colitis Neg Hx     Social History   Socioeconomic History   Marital status: Married    Spouse name: Not on file   Number of children: Not on file   Years of education: Not on  file   Highest education level: Not on file  Occupational History   Not on file  Tobacco Use   Smoking status: Never   Smokeless tobacco: Never  Vaping Use   Vaping Use: Never used  Substance and Sexual Activity   Alcohol use: No    Alcohol/week: 0.0 standard drinks of alcohol   Drug use: No   Sexual activity: Yes    Partners: Male    Birth control/protection: Post-menopausal  Other Topics Concern   Not on file  Social History Narrative   Lives with husband.  One daughter and one grand.     Social Determinants of Health   Financial Resource Strain: Not on file  Food Insecurity: Not on file  Transportation Needs: Not on file  Physical Activity: Not on file  Stress: Not on file  Social Connections: Not on file  Intimate Partner Violence: Not on file    Review of Systems:  All systems reviewed were negative except where noted in HPI.   Physical Exam: General:  Alert, well-developed, in NAD Head:  Normocephalic and atraumatic. Eyes:  Sclera clear, no icterus.   Conjunctiva pink. Ears:  Normal auditory acuity. Mouth:  No deformity or lesions.  Neck:  Supple; no masses. Lungs:  Clear throughout to auscultation.   No wheezes, crackles, or rhonchi.  Heart:  Regular rate and rhythm; no murmurs. Abdomen:  Soft, nondistended, nontender. No masses, hepatomegaly. No palpable masses.  Normal bowel sounds.    Rectal:  Deferred   Msk:  Symmetrical without gross deformities. Extremities:  Without edema. Neurologic:  Alert and  oriented x 4; grossly normal neurologically. Skin:  Intact without significant lesions or  rashes. Psych:  Alert and cooperative. Normal mood and affect.   Venita Lick. Russella Dar  02/20/2023, 11:50 AM See Loretha Stapler, [facility], to contact our on call provider

## 2023-02-20 NOTE — Patient Instructions (Signed)
Resume your current medications.  YOU HAD AN ENDOSCOPIC PROCEDURE TODAY AT THE [facility]:   Refer to the procedure report that was given to you for any specific questions about what was found during the examination.  If the procedure report does not answer your questions, please call your gastroenterologist to clarify.  If you requested that your care partner not be given the details of your procedure findings, then the procedure report has been included in a sealed envelope for you to review at your convenience later.  YOU SHOULD EXPECT: Some feelings of bloating in the abdomen. Passage of more gas than usual.  Walking can help get rid of the air that was put into your GI tract during the procedure and reduce the bloating. If you had a lower endoscopy (such as a colonoscopy or flexible sigmoidoscopy) you may notice spotting of blood in your stool or on the toilet paper. If you underwent a bowel prep for your procedure, you may not have a normal bowel movement for a few days.  Please Note:  You might notice some irritation and congestion in your nose or some drainage.  This is from the oxygen used during your procedure.  There is no need for concern and it should clear up in a day or so.  SYMPTOMS TO REPORT IMMEDIATELY:  Following lower endoscopy (colonoscopy or flexible sigmoidoscopy):  Excessive amounts of blood in the stool  Significant tenderness or worsening of abdominal pains  Swelling of the abdomen that is new, acute Black, tarry-looking stools  For urgent or emergent issues, a gastroenterologist can be reached at any hour by calling [phone]. Do not use MyChart messaging for urgent concerns.    DIET:  We do recommend a small meal at first, but then you may proceed to your regular diet.  Drink plenty of fluids but you should avoid alcoholic beverages for 24 hours.  Try to increase the fiber in your diet,and drink plenty of water.  ACTIVITY:  You should plan to take  it easy for the rest of today and you should NOT DRIVE or use heavy machinery until tomorrow (because of the sedation medicines used during the test).    FOLLOW UP: Our staff will call the number listed on your records the next business day following your procedure.  We will call around 7:15- 8:00 am to check on you and address any questions or concerns that you may have regarding the information given to you following your procedure. If we do not reach you, we will leave a message.     If any biopsies were taken you will be contacted by phone or by letter within the next 1-3 weeks.  Please call us at [phone] if you have not heard about the biopsies in 3 weeks.    SIGNATURES/CONFIDENTIALITY: You and/or your care partner have signed paperwork which will be entered into your electronic medical record.  These signatures attest to the fact that that the information above on your After Visit Summary has been reviewed and is understood.  Full responsibility of the confidentiality of this discharge information lies with you and/or your care-partner.

## 2023-02-21 ENCOUNTER — Telehealth: Payer: Self-pay | Admitting: *Deleted

## 2023-02-21 NOTE — Telephone Encounter (Signed)
  Follow up Call-     02/20/2023   11:01 AM 02/20/2023   10:52 AM  Call back number  Post procedure Call Back phone  # [phone]   Permission to leave phone message  Yes     Patient questions:  Do you have a fever, pain , or abdominal swelling? No. Pain Score  0 *  Have you tolerated food without any problems? Yes.    Have you been able to return to your normal activities? Yes.    Do you have any questions about your discharge instructions: Diet   No. Medications  No. Follow up visit  No.  Do you have questions or concerns about your Care? No.  Actions: * If pain score is 4 or above: No action needed, pain <4.

## 2023-03-12 ENCOUNTER — Encounter: Payer: Self-pay | Admitting: Gastroenterology

## 2023-03-25 ENCOUNTER — Telehealth: Payer: Self-pay | Admitting: Gastroenterology

## 2023-03-25 NOTE — Telephone Encounter (Signed)
Patient called states she had a colonoscopy done and has not had a normal BM since. Seeking advise.

## 2023-03-25 NOTE — Telephone Encounter (Signed)
The pt states that since her colon on 02/20/2023 her bowel habits have not returned to normal.  She has stopped eating as many vegetable and fruits.  She states she has 1-2 loose BM's in the morning then returns to normal until the next morning.  She has no bleeding, no pain or other symptoms.  I have advised her to add benefiber daily starting slowly and increase her fluids.  She will call back to update [place] in 1-2 weeks unless needed prior.  FYI Dr Russella Dar

## 2023-03-26 DIAGNOSIS — Z85828 Personal history of other malignant neoplasm of skin: Secondary | ICD-10-CM | POA: Diagnosis not present

## 2023-03-26 DIAGNOSIS — L570 Actinic keratosis: Secondary | ICD-10-CM | POA: Diagnosis not present

## 2023-03-26 DIAGNOSIS — L821 Other seborrheic keratosis: Secondary | ICD-10-CM | POA: Diagnosis not present

## 2023-04-03 DIAGNOSIS — M0579 Rheumatoid arthritis with rheumatoid factor of multiple sites without organ or systems involvement: Secondary | ICD-10-CM | POA: Diagnosis not present

## 2023-05-07 DIAGNOSIS — M0579 Rheumatoid arthritis with rheumatoid factor of multiple sites without organ or systems involvement: Secondary | ICD-10-CM | POA: Diagnosis not present

## 2023-05-09 ENCOUNTER — Ambulatory Visit: Payer: Medicare PPO | Admitting: Podiatry

## 2023-05-09 ENCOUNTER — Encounter: Payer: Self-pay | Admitting: Podiatry

## 2023-05-09 VITALS — BP 151/76 | HR 75

## 2023-05-09 DIAGNOSIS — S90821A Blister (nonthermal), right foot, initial encounter: Secondary | ICD-10-CM

## 2023-05-09 DIAGNOSIS — S90822A Blister (nonthermal), left foot, initial encounter: Secondary | ICD-10-CM

## 2023-05-09 DIAGNOSIS — L089 Local infection of the skin and subcutaneous tissue, unspecified: Secondary | ICD-10-CM

## 2023-05-09 MED ORDER — CEPHALEXIN 500 MG PO CAPS: 500.0000 mg | ORAL_CAPSULE | Freq: Three times a day (TID) | ORAL | 0 refills | Status: AC

## 2023-05-09 NOTE — Progress Notes (Signed)
Subjective:  Patient ID: Jacqueline Tucker, female    DOB: [birth year],  MRN: [id number]  Chief Complaint  Patient presents with   Foot Pain    "I went for a walk on the beach and now I have these huge blisters on the bottom of my feet." N - blisters L - met area bilateral D - 1 week ago O - suddenly, getting better C - blister, tender, sore, occasionally sharp pain w/out shoes A - pressure, walking, standing, certain shoes T - tried to keep them covered, I put Mupirocin on them    72 y.o. female presents with concern for blisters in the bilateral forefoot.  She states she was walking on the beach approximately 1 week ago in bare feet on wet saline water and then she developed blisters.  She did not walk for distance.  She says they are starting to get better.  She says applying pressure does make ankle.  Past Medical History:  Diagnosis Date   Abnormal Pap smear of cervix 07/1998   LGSIL   Allergy    SEASONAL   Arthritis    Cancer (HCC)    SQUAMOUS CELL   Cataract    BEGINNING   Complication of anesthesia    Headache   GERD (gastroesophageal reflux disease)    Headache(784.0)    migraines   Hypertension    Osteoporosis    Pneumonia    PONV (postoperative nausea and vomiting)    Rheumatoid arthritis (HCC)     Allergies  Allergen Reactions   Codeine     fainting   Alendronate     Other Reaction(s): achiness   Bacitracin-Polymyxin B Itching   Latex Itching   Lisinopril Cough   Neosporin [Neomycin-Bacitracin Zn-Polymyx] Rash    ROS: Negative except as per HPI above  Objective:  General: AAO x3, NAD  Dermatological: Pressure blister present on the bilateral forefoot to the breakdown of skin no deep wounds.  There is mild malodor and drainage present on the left forefoot however no significant erythema or purulent drainage.  Very painful to touch.  Vascular:  Dorsalis Pedis artery and Posterior Tibial artery pedal pulses are 2/4 bilateral.  Capillary fill time < 3  sec to all digits.   Neruologic: Grossly intact via light touch bilateral. Protective threshold intact to all sites bilateral.   Musculoskeletal: No gross boney pedal deformities bilateral. No pain, crepitus, or limitation noted with foot and ankle range of motion bilateral. Muscular strength 5/5 in all groups tested bilateral.  Gait: Unassisted, Nonantalgic.   No images are attached to the encounter.  Radiographs:  Deferred Assessment:   1. Infected blister of left foot, initial encounter   2. Blister of right foot, initial encounter      Plan:  Patient was evaluated and treated and all questions answered.  # Bilateral forefoot pressure blisters, concern for mild infection on the left forefoot -Discussed with patient she does have pressure blisters present on bilateral forefoot -Debrided the dead skin overlying the blisters on both feet -As she is immune suppressed I would recommend a short taking robotics.  E Rx for cephalexin milligrams daily for 5 days -Discussed wound care with the patient in detail including proton iodine ointment applied daily to the bilateral plantar forefoot wounds followed by nonadherent gauze and covering Coflex -Monitoring for worsening infection and offloading as this will help promote wound  Return in about 3 weeks (around 05/30/2023) for f/u bilateral forefoot ulcers.  Corinna Gab, DPM [facility] / [facility]

## 2023-06-04 DIAGNOSIS — Z6826 Body mass index (BMI) 26.0-26.9, adult: Secondary | ICD-10-CM | POA: Diagnosis not present

## 2023-06-04 DIAGNOSIS — H6121 Impacted cerumen, right ear: Secondary | ICD-10-CM | POA: Diagnosis not present

## 2023-06-06 ENCOUNTER — Ambulatory Visit: Payer: Medicare PPO | Admitting: Podiatry

## 2023-06-06 DIAGNOSIS — L03116 Cellulitis of left lower limb: Secondary | ICD-10-CM | POA: Diagnosis not present

## 2023-06-06 DIAGNOSIS — S90821D Blister (nonthermal), right foot, subsequent encounter: Secondary | ICD-10-CM

## 2023-06-06 DIAGNOSIS — L97521 Non-pressure chronic ulcer of other part of left foot limited to breakdown of skin: Secondary | ICD-10-CM

## 2023-06-06 DIAGNOSIS — S90822D Blister (nonthermal), left foot, subsequent encounter: Secondary | ICD-10-CM

## 2023-06-06 MED ORDER — CEPHALEXIN 500 MG PO CAPS: 500.0000 mg | ORAL_CAPSULE | Freq: Three times a day (TID) | ORAL | 0 refills | Status: AC

## 2023-06-06 NOTE — Progress Notes (Signed)
Subjective:  Patient ID: Jacqueline Tucker, female    DOB: September 04, 1951,  MRN: [id number]  Chief Complaint  Patient presents with   Follow-up    Blisters on bilateral ball of feet are healed. Patient now has a blister in between 4th and 5th toe on left foot. She has been applying mupirocin ointment to area. Not diabetic.     72 y.o. female presents for follow-up of bilateral forefoot blisters.  She says they are now healed.  She does have a new blister that formed in between the fourth and fifth toe on the left foot.  She has been applying mupirocin ointment to the area.  She has not had a history of diabetes but does take an immunosuppressant drug.  Past Medical History:  Diagnosis Date   Abnormal Pap smear of cervix 07/1998   LGSIL   Allergy    SEASONAL   Arthritis    Cancer (HCC)    SQUAMOUS CELL   Cataract    BEGINNING   Complication of anesthesia    Headache   GERD (gastroesophageal reflux disease)    Headache(784.0)    migraines   Hypertension    Osteoporosis    Pneumonia    PONV (postoperative nausea and vomiting)    Rheumatoid arthritis (HCC)     Allergies  Allergen Reactions   Codeine     fainting   Alendronate     Other Reaction(s): achiness   Bacitracin-Polymyxin B Itching   Latex Itching   Lisinopril Cough   Zoster Vac Recomb Adjuvanted Other (See Comments)    Fever   Neosporin [Neomycin-Bacitracin Zn-Polymyx] Rash    ROS: Negative except as per HPI above  Objective:  General: AAO x3, NAD  Dermatological: Pressure blister present on the bilateral forefoot healed with normal healthy skin.  There is mild xerosis however there is peeling skin and overall healthy.  On the left foot in between the fourth and fifth toe at the webspace there is noted to be superficial ulceration limited to breakdown of skin.  Mild erythema of the fifth toe.  No significant drainage very superficial wound/blister.    Vascular:  Dorsalis Pedis artery and Posterior Tibial artery  pedal pulses are 2/4 bilateral.  Capillary fill time < 3 sec to all digits.   Neruologic: Grossly intact via light touch bilateral. Protective threshold intact to all sites bilateral.   Musculoskeletal: No gross boney pedal deformities bilateral. No pain, crepitus, or limitation noted with foot and ankle range of motion bilateral. Muscular strength 5/5 in all groups tested bilateral.  Gait: Unassisted, Nonantalgic.   No images are attached to the encounter.  Radiographs:  Deferred Assessment:   1. Ulcer of left foot, limited to breakdown of skin (HCC)   2. Blister of left foot, subsequent encounter   3. Cellulitis of left foot   4. Blister of right foot, subsequent encounter       Plan:  Patient was evaluated and treated and all questions answered.  # New superficial ulceration present in the left fourth webspace -Patient has superficial ulceration present at the base of the fourth and fifth toes/webspace. -Mild cellulitis of the left fifth toe -eRx for cephalexin 500 mg 3 times daily for 5 days -Recommend gauze with Betadine in between the fourth and fifth toe and cover with Coban wrap -Provided patient with iodine ointment as well to place over the area. -No debridement was indicated as very superficial ulcerations  # Bilateral forefoot pressure blisters -Plantar forefoot blisters bilateral  foot are now fully healed. -Continue to monitor for any recurrence however recommend shoe gear when on beach to prevent pressure blistering  # New superficial ulceration present in the left fourth webspace  No follow-ups on file.          Corinna Gab, DPM [facility]

## 2023-07-03 DIAGNOSIS — R202 Paresthesia of skin: Secondary | ICD-10-CM | POA: Diagnosis not present

## 2023-07-03 DIAGNOSIS — R5383 Other fatigue: Secondary | ICD-10-CM | POA: Diagnosis not present

## 2023-07-03 DIAGNOSIS — M1991 Primary osteoarthritis, unspecified site: Secondary | ICD-10-CM | POA: Diagnosis not present

## 2023-07-03 DIAGNOSIS — E538 Deficiency of other specified B group vitamins: Secondary | ICD-10-CM | POA: Diagnosis not present

## 2023-07-03 DIAGNOSIS — I7300 Raynaud's syndrome without gangrene: Secondary | ICD-10-CM | POA: Diagnosis not present

## 2023-07-03 DIAGNOSIS — Z79899 Other long term (current) drug therapy: Secondary | ICD-10-CM | POA: Diagnosis not present

## 2023-07-03 DIAGNOSIS — M810 Age-related osteoporosis without current pathological fracture: Secondary | ICD-10-CM | POA: Diagnosis not present

## 2023-07-03 DIAGNOSIS — Z6825 Body mass index (BMI) 25.0-25.9, adult: Secondary | ICD-10-CM | POA: Diagnosis not present

## 2023-07-03 DIAGNOSIS — M0579 Rheumatoid arthritis with rheumatoid factor of multiple sites without organ or systems involvement: Secondary | ICD-10-CM | POA: Diagnosis not present

## 2023-07-18 ENCOUNTER — Ambulatory Visit: Payer: Medicare PPO | Admitting: Podiatry

## 2023-07-22 DIAGNOSIS — M810 Age-related osteoporosis without current pathological fracture: Secondary | ICD-10-CM | POA: Diagnosis not present

## 2023-08-20 DIAGNOSIS — L821 Other seborrheic keratosis: Secondary | ICD-10-CM | POA: Diagnosis not present

## 2023-08-20 DIAGNOSIS — L820 Inflamed seborrheic keratosis: Secondary | ICD-10-CM | POA: Diagnosis not present

## 2023-08-20 DIAGNOSIS — D225 Melanocytic nevi of trunk: Secondary | ICD-10-CM | POA: Diagnosis not present

## 2023-08-20 DIAGNOSIS — L84 Corns and callosities: Secondary | ICD-10-CM | POA: Diagnosis not present

## 2023-08-20 DIAGNOSIS — D485 Neoplasm of uncertain behavior of skin: Secondary | ICD-10-CM | POA: Diagnosis not present

## 2023-08-20 DIAGNOSIS — Z85828 Personal history of other malignant neoplasm of skin: Secondary | ICD-10-CM | POA: Diagnosis not present

## 2023-08-20 DIAGNOSIS — L570 Actinic keratosis: Secondary | ICD-10-CM | POA: Diagnosis not present

## 2023-09-09 ENCOUNTER — Other Ambulatory Visit: Payer: Self-pay

## 2023-09-09 DIAGNOSIS — M810 Age-related osteoporosis without current pathological fracture: Secondary | ICD-10-CM | POA: Insufficient documentation

## 2023-09-11 ENCOUNTER — Telehealth: Payer: Self-pay | Admitting: Pharmacy Technician

## 2023-09-11 NOTE — Telephone Encounter (Signed)
Auth Submission: APPROVED Site of care: Site of care: [facility] Payer: HUMANA MEDICARE Medication & CPT/J Code(s) submitted: Prolia (Denosumab) [id number] Route of submission (phone, fax, portal):  Phone # Fax # Auth type: Buy/Bill PB Units/visits requested: X2 DOSES Reference number: [id number] Approval from: 09/11/23 to 10/21/24

## 2023-10-02 DIAGNOSIS — M0579 Rheumatoid arthritis with rheumatoid factor of multiple sites without organ or systems involvement: Secondary | ICD-10-CM | POA: Diagnosis not present

## 2023-10-22 DIAGNOSIS — K219 Gastro-esophageal reflux disease without esophagitis: Secondary | ICD-10-CM | POA: Diagnosis not present

## 2023-10-22 DIAGNOSIS — H269 Unspecified cataract: Secondary | ICD-10-CM | POA: Diagnosis not present

## 2023-10-22 DIAGNOSIS — M069 Rheumatoid arthritis, unspecified: Secondary | ICD-10-CM | POA: Diagnosis not present

## 2023-10-22 DIAGNOSIS — M1990 Unspecified osteoarthritis, unspecified site: Secondary | ICD-10-CM | POA: Diagnosis not present

## 2023-10-22 DIAGNOSIS — D529 Folate deficiency anemia, unspecified: Secondary | ICD-10-CM | POA: Diagnosis not present

## 2023-10-22 DIAGNOSIS — D84821 Immunodeficiency due to drugs: Secondary | ICD-10-CM | POA: Diagnosis not present

## 2023-10-22 DIAGNOSIS — Z8249 Family history of ischemic heart disease and other diseases of the circulatory system: Secondary | ICD-10-CM | POA: Diagnosis not present

## 2023-10-22 DIAGNOSIS — N182 Chronic kidney disease, stage 2 (mild): Secondary | ICD-10-CM | POA: Diagnosis not present

## 2023-10-22 DIAGNOSIS — M810 Age-related osteoporosis without current pathological fracture: Secondary | ICD-10-CM | POA: Diagnosis not present

## 2023-11-14 DIAGNOSIS — Z1231 Encounter for screening mammogram for malignant neoplasm of breast: Secondary | ICD-10-CM | POA: Diagnosis not present

## 2023-11-18 ENCOUNTER — Encounter: Payer: Self-pay | Admitting: Obstetrics and Gynecology

## 2023-12-25 DIAGNOSIS — H5203 Hypermetropia, bilateral: Secondary | ICD-10-CM | POA: Diagnosis not present

## 2023-12-25 DIAGNOSIS — H04123 Dry eye syndrome of bilateral lacrimal glands: Secondary | ICD-10-CM | POA: Diagnosis not present

## 2023-12-25 DIAGNOSIS — H2513 Age-related nuclear cataract, bilateral: Secondary | ICD-10-CM | POA: Diagnosis not present

## 2023-12-25 DIAGNOSIS — H524 Presbyopia: Secondary | ICD-10-CM | POA: Diagnosis not present

## 2023-12-25 DIAGNOSIS — H43813 Vitreous degeneration, bilateral: Secondary | ICD-10-CM | POA: Diagnosis not present

## 2023-12-25 DIAGNOSIS — H35371 Puckering of macula, right eye: Secondary | ICD-10-CM | POA: Diagnosis not present

## 2023-12-25 DIAGNOSIS — H18593 Other hereditary corneal dystrophies, bilateral: Secondary | ICD-10-CM | POA: Diagnosis not present

## 2023-12-31 DIAGNOSIS — M810 Age-related osteoporosis without current pathological fracture: Secondary | ICD-10-CM | POA: Diagnosis not present

## 2023-12-31 DIAGNOSIS — Z0000 Encounter for general adult medical examination without abnormal findings: Secondary | ICD-10-CM | POA: Diagnosis not present

## 2023-12-31 DIAGNOSIS — I10 Essential (primary) hypertension: Secondary | ICD-10-CM | POA: Diagnosis not present

## 2023-12-31 DIAGNOSIS — R921 Mammographic calcification found on diagnostic imaging of breast: Secondary | ICD-10-CM | POA: Diagnosis not present

## 2023-12-31 DIAGNOSIS — R0781 Pleurodynia: Secondary | ICD-10-CM | POA: Diagnosis not present

## 2023-12-31 DIAGNOSIS — M069 Rheumatoid arthritis, unspecified: Secondary | ICD-10-CM | POA: Diagnosis not present

## 2024-01-01 DIAGNOSIS — Z79899 Other long term (current) drug therapy: Secondary | ICD-10-CM | POA: Diagnosis not present

## 2024-01-01 DIAGNOSIS — M0579 Rheumatoid arthritis with rheumatoid factor of multiple sites without organ or systems involvement: Secondary | ICD-10-CM | POA: Diagnosis not present

## 2024-01-01 DIAGNOSIS — R5383 Other fatigue: Secondary | ICD-10-CM | POA: Diagnosis not present

## 2024-01-01 DIAGNOSIS — M810 Age-related osteoporosis without current pathological fracture: Secondary | ICD-10-CM | POA: Diagnosis not present

## 2024-01-01 DIAGNOSIS — E663 Overweight: Secondary | ICD-10-CM | POA: Diagnosis not present

## 2024-01-01 DIAGNOSIS — M1991 Primary osteoarthritis, unspecified site: Secondary | ICD-10-CM | POA: Diagnosis not present

## 2024-01-01 DIAGNOSIS — Z6825 Body mass index (BMI) 25.0-25.9, adult: Secondary | ICD-10-CM | POA: Diagnosis not present

## 2024-01-21 ENCOUNTER — Ambulatory Visit: Payer: Medicare PPO

## 2024-01-21 VITALS — BP 137/82 | HR 71 | Temp 97.1°F | Resp 16 | Ht 64.0 in | Wt 150.4 lb

## 2024-01-21 DIAGNOSIS — M810 Age-related osteoporosis without current pathological fracture: Secondary | ICD-10-CM

## 2024-01-21 MED ORDER — DENOSUMAB 60 MG/ML ~~LOC~~ SOSY
60.0000 mg | PREFILLED_SYRINGE | Freq: Once | SUBCUTANEOUS | Status: AC
  Administered 2024-01-21: 60 mg via SUBCUTANEOUS

## 2024-01-21 NOTE — Progress Notes (Signed)
 Diagnosis: Osteoporosis  Provider:  Chilton Greathouse MD  Procedure: Injection  Prolia (Denosumab), Dose: 60 mg, Site: subcutaneous, Number of injections: 1  Injection Site(s): Left arm  Post Care:  left arm injection  Discharge: Condition: Good, Destination: Home . AVS Declined  Performed by:  Rico Ala, LPN

## 2024-01-29 DIAGNOSIS — M5416 Radiculopathy, lumbar region: Secondary | ICD-10-CM | POA: Diagnosis not present

## 2024-01-29 DIAGNOSIS — M545 Low back pain, unspecified: Secondary | ICD-10-CM | POA: Diagnosis not present

## 2024-02-07 DIAGNOSIS — J020 Streptococcal pharyngitis: Secondary | ICD-10-CM | POA: Diagnosis not present

## 2024-02-07 DIAGNOSIS — R059 Cough, unspecified: Secondary | ICD-10-CM | POA: Diagnosis not present

## 2024-02-14 DIAGNOSIS — M25552 Pain in left hip: Secondary | ICD-10-CM | POA: Diagnosis not present

## 2024-02-14 DIAGNOSIS — M545 Low back pain, unspecified: Secondary | ICD-10-CM | POA: Diagnosis not present

## 2024-02-26 DIAGNOSIS — M25552 Pain in left hip: Secondary | ICD-10-CM | POA: Diagnosis not present

## 2024-02-26 DIAGNOSIS — M545 Low back pain, unspecified: Secondary | ICD-10-CM | POA: Diagnosis not present

## 2024-02-28 DIAGNOSIS — M545 Low back pain, unspecified: Secondary | ICD-10-CM | POA: Diagnosis not present

## 2024-02-28 DIAGNOSIS — M25552 Pain in left hip: Secondary | ICD-10-CM | POA: Diagnosis not present

## 2024-03-02 DIAGNOSIS — M545 Low back pain, unspecified: Secondary | ICD-10-CM | POA: Diagnosis not present

## 2024-03-02 DIAGNOSIS — M25552 Pain in left hip: Secondary | ICD-10-CM | POA: Diagnosis not present

## 2024-03-04 DIAGNOSIS — M545 Low back pain, unspecified: Secondary | ICD-10-CM | POA: Diagnosis not present

## 2024-03-04 DIAGNOSIS — M25552 Pain in left hip: Secondary | ICD-10-CM | POA: Diagnosis not present

## 2024-03-09 DIAGNOSIS — M25552 Pain in left hip: Secondary | ICD-10-CM | POA: Diagnosis not present

## 2024-03-09 DIAGNOSIS — M545 Low back pain, unspecified: Secondary | ICD-10-CM | POA: Diagnosis not present

## 2024-03-09 DIAGNOSIS — M961 Postlaminectomy syndrome, not elsewhere classified: Secondary | ICD-10-CM | POA: Diagnosis not present

## 2024-03-09 DIAGNOSIS — M47816 Spondylosis without myelopathy or radiculopathy, lumbar region: Secondary | ICD-10-CM | POA: Diagnosis not present

## 2024-03-09 DIAGNOSIS — M5416 Radiculopathy, lumbar region: Secondary | ICD-10-CM | POA: Diagnosis not present

## 2024-03-12 DIAGNOSIS — M545 Low back pain, unspecified: Secondary | ICD-10-CM | POA: Diagnosis not present

## 2024-03-12 DIAGNOSIS — M25552 Pain in left hip: Secondary | ICD-10-CM | POA: Diagnosis not present

## 2024-03-24 DIAGNOSIS — M5416 Radiculopathy, lumbar region: Secondary | ICD-10-CM | POA: Diagnosis not present

## 2024-03-30 DIAGNOSIS — M25552 Pain in left hip: Secondary | ICD-10-CM | POA: Diagnosis not present

## 2024-03-30 DIAGNOSIS — M545 Low back pain, unspecified: Secondary | ICD-10-CM | POA: Diagnosis not present

## 2024-04-01 DIAGNOSIS — M0579 Rheumatoid arthritis with rheumatoid factor of multiple sites without organ or systems involvement: Secondary | ICD-10-CM | POA: Diagnosis not present

## 2024-04-08 DIAGNOSIS — M25552 Pain in left hip: Secondary | ICD-10-CM | POA: Diagnosis not present

## 2024-04-08 DIAGNOSIS — M545 Low back pain, unspecified: Secondary | ICD-10-CM | POA: Diagnosis not present

## 2024-04-14 DIAGNOSIS — M545 Low back pain, unspecified: Secondary | ICD-10-CM | POA: Diagnosis not present

## 2024-04-14 DIAGNOSIS — M25552 Pain in left hip: Secondary | ICD-10-CM | POA: Diagnosis not present

## 2024-04-17 DIAGNOSIS — M5416 Radiculopathy, lumbar region: Secondary | ICD-10-CM | POA: Diagnosis not present

## 2024-04-17 DIAGNOSIS — M545 Low back pain, unspecified: Secondary | ICD-10-CM | POA: Diagnosis not present

## 2024-05-02 DIAGNOSIS — M5416 Radiculopathy, lumbar region: Secondary | ICD-10-CM | POA: Diagnosis not present

## 2024-05-06 DIAGNOSIS — M5416 Radiculopathy, lumbar region: Secondary | ICD-10-CM | POA: Diagnosis not present

## 2024-06-30 DIAGNOSIS — Z23 Encounter for immunization: Secondary | ICD-10-CM | POA: Diagnosis not present

## 2024-07-03 DIAGNOSIS — I7300 Raynaud's syndrome without gangrene: Secondary | ICD-10-CM | POA: Diagnosis not present

## 2024-07-03 DIAGNOSIS — M1991 Primary osteoarthritis, unspecified site: Secondary | ICD-10-CM | POA: Diagnosis not present

## 2024-07-03 DIAGNOSIS — Z79899 Other long term (current) drug therapy: Secondary | ICD-10-CM | POA: Diagnosis not present

## 2024-07-03 DIAGNOSIS — M0579 Rheumatoid arthritis with rheumatoid factor of multiple sites without organ or systems involvement: Secondary | ICD-10-CM | POA: Diagnosis not present

## 2024-07-03 DIAGNOSIS — Z6824 Body mass index (BMI) 24.0-24.9, adult: Secondary | ICD-10-CM | POA: Diagnosis not present

## 2024-07-03 DIAGNOSIS — M810 Age-related osteoporosis without current pathological fracture: Secondary | ICD-10-CM | POA: Diagnosis not present

## 2024-07-23 ENCOUNTER — Ambulatory Visit (INDEPENDENT_AMBULATORY_CARE_PROVIDER_SITE_OTHER): Admitting: *Deleted

## 2024-07-23 VITALS — BP 132/83 | HR 71 | Resp 18 | Ht 64.0 in | Wt 146.8 lb

## 2024-07-23 DIAGNOSIS — M810 Age-related osteoporosis without current pathological fracture: Secondary | ICD-10-CM

## 2024-07-23 MED ORDER — DENOSUMAB 60 MG/ML ~~LOC~~ SOSY
60.0000 mg | PREFILLED_SYRINGE | Freq: Once | SUBCUTANEOUS | Status: AC
  Administered 2024-07-23: 60 mg via SUBCUTANEOUS

## 2024-07-23 NOTE — Progress Notes (Signed)
 Diagnosis: Osteoporosis  Provider:  Mannam, Praveen MD  Procedure: Injection  Prolia  (Denosumab ), Dose: 60 mg, Site: subcutaneous, Number of injections: 1  Injection Site(s): Left arm  Post Care: Patient declined observation  Discharge: Condition: Good, Destination: Home . AVS Declined  Performed by:  Mathew Therisa NOVAK, RN

## 2024-07-26 ENCOUNTER — Other Ambulatory Visit: Payer: Self-pay | Admitting: Medical Genetics

## 2024-08-26 ENCOUNTER — Other Ambulatory Visit

## 2024-08-26 DIAGNOSIS — Z006 Encounter for examination for normal comparison and control in clinical research program: Secondary | ICD-10-CM

## 2024-09-03 ENCOUNTER — Ambulatory Visit: Admitting: Nurse Practitioner

## 2024-09-03 VITALS — BP 142/92 | HR 80 | Temp 97.8°F | Ht 64.0 in | Wt 147.4 lb

## 2024-09-03 DIAGNOSIS — Z8249 Family history of ischemic heart disease and other diseases of the circulatory system: Secondary | ICD-10-CM

## 2024-09-03 DIAGNOSIS — M810 Age-related osteoporosis without current pathological fracture: Secondary | ICD-10-CM | POA: Diagnosis not present

## 2024-09-03 DIAGNOSIS — Z23 Encounter for immunization: Secondary | ICD-10-CM

## 2024-09-03 DIAGNOSIS — Z136 Encounter for screening for cardiovascular disorders: Secondary | ICD-10-CM | POA: Diagnosis not present

## 2024-09-03 DIAGNOSIS — I10 Essential (primary) hypertension: Secondary | ICD-10-CM | POA: Diagnosis not present

## 2024-09-03 MED ORDER — AMLODIPINE BESYLATE 2.5 MG PO TABS: 2.5000 mg | ORAL_TABLET | Freq: Every day | ORAL | 3 refills | Status: AC

## 2024-09-03 NOTE — Assessment & Plan Note (Signed)
 Immunizations - Discussed administration of Prevnar 20 with clinical pharmacist. No contraindication r/t to her being on Rinvoq. Patient had negative side effect to Shingrix, but does not have true allergy. Prevnar 20 administered, VIS provided.

## 2024-09-03 NOTE — Assessment & Plan Note (Signed)
 Screening for cardiovascular disease Intermittent palpitations, resolving quickly. No associated symptoms. - Ordered calcium score CT scan to assess heart health and risk of heart attack or stroke. - Advised to report new or worsening symptoms such as prolonged palpitations, chest pain, or shortness of breath.

## 2024-09-03 NOTE — Progress Notes (Signed)
 New Patient Office Visit  Subjective    Patient ID: Jacqueline Tucker, female    DOB: 1951-01-30  Age: 73 y.o. MRN: [id number]  CC:  Chief Complaint  Patient presents with   Hypertension    HPI Jacqueline Tucker presents to establish care Discussed the use of AI scribe software for clinical note transcription with the patient, who gave verbal consent to proceed.  History of Present Illness [name] is a 73 year old female with hypertension and rheumatoid arthritis who presents for medication refill and evaluation of cardiovascular risk.  Hypertension management - Requires refill of amlodipine, taking 5 mg and 2.5 mg daily - Not regularly monitoring blood pressure at home - No headache, blurry vision, chest pain, or shortness of breath  Cardiovascular risk assessment - Concerned about cardiovascular risk due to family history of sudden cardiac events (mother and grandmother died suddenly from heart-related/stroke issues in their eighties) - Not on cholesterol medication - Cholesterol monitored annually by rheumatologist - Occasional palpitations that resolve within moments, not impacting ADLs or quality of life - No new fatigue, leg swelling, or shortness of breath except when anxious  Rheumatoid arthritis and medication concerns - History of rheumatoid arthritis - Follows with Dr. Ishmael - Treated with methotrexate and Rinvoq    Outpatient Encounter Medications as of 09/03/2024  Medication Sig   acetaminophen  (TYLENOL ) 500 MG tablet Take 2 tablets (1,000 mg total) by mouth every 6 (six) hours as needed for moderate pain. Do not take if using Hydrocodone consistently, which does contain Tylenol    amLODipine (NORVASC) 2.5 MG tablet Take 1 tablet (2.5 mg total) by mouth daily. Take 2.5 mg by mouth daily.   amLODipine (NORVASC) 5 MG tablet 1 tablet Orally Once a day for 30 day(s)   Biotin 1000 MCG tablet Take 1,000 mcg by mouth daily.   Calcium Carb-Cholecalciferol (CALCIUM  600 + D PO) Take 1 tablet by mouth every other day.   Calcium-Vitamin D -Vitamin K 5412386358-40 MG-UNT-MCG CHEW as directed Orally   Carboxymethylcellul-Glycerin (LUBRICATING EYE DROPS OP) Place 1 drop into both eyes daily as needed (dry eyes).   celecoxib  (CELEBREX ) 200 MG capsule Take 200 mg by mouth 2 (two) times daily.    Cholecalciferol (VITAMIN D ) 50 MCG (2000 UT) CAPS Take 2,000 Units by mouth daily.   denosumab  (PROLIA ) 60 MG/ML SOLN injection Inject 60 mg into the skin every 6 (six) months. Administer in upper arm, thigh, or abdomen   folic acid  (FOLVITE ) 1 MG tablet Take 2 mg by mouth daily.   Magnesium  250 MG TABS 1 tablet with a meal Orally Once a day   MAGNESIUM  PO Take 250 mg by mouth daily.   methotrexate (RHEUMATREX) 2.5 MG tablet Take 20 mg by mouth every Sunday. Caution:Chemotherapy. Protect from light.   Multiple Vitamin (MULTIVITAMIN ADULT PO) Take by mouth. TAKE EVERY OTHER DAY   mupirocin ointment (BACTROBAN) 2 % Apply topically 2 (two) times daily.   Upadacitinib ER (RINVOQ) 15 MG TB24 Take by mouth daily.   [DISCONTINUED] amLODipine (NORVASC) 2.5 MG tablet Take 2.5 mg by mouth daily.   [DISCONTINUED] Multiple Vitamin (MULTI VITAMIN) TABS 1 tablet Orally Once a day   [DISCONTINUED] Multiple Vitamin (MULTIVITAMIN WITH MINERALS) TABS tablet Take 1 tablet by mouth every other day.    No facility-administered encounter medications on file as of 09/03/2024.    Past Medical History:  Diagnosis Date   Abnormal Pap smear of cervix 07/1998   LGSIL   Allergy  SEASONAL   Arthritis    Cancer (HCC)    SQUAMOUS CELL   Cataract    BEGINNING   Complication of anesthesia    Headache   GERD (gastroesophageal reflux disease)    Headache(784.0)    migraines   Hypertension    Osteoporosis    Pneumonia    PONV (postoperative nausea and vomiting)    Rheumatoid arthritis (HCC)     Past Surgical History:  Procedure Laterality Date   CRYOTHERAPY  1984   dysplasia    DECOMPRESSIVE LUMBAR LAMINECTOMY LEVEL 2 N/A 11/11/2013   Procedure: DECOMPRESSIVE LUMBAR LAMINECTOMY L3-L5;  Surgeon: Donaciano Sprang, MD;  Location: [facility] OR;  Service: Orthopedics;  Laterality: N/A;   DILATION AND CURETTAGE OF UTERUS  1990   SAB   LUMBAR LAMINECTOMY  11/11/2013   L3   L5     DR BROOKS   SKIN CANCER EXCISION     Hand   TOTAL HIP ARTHROPLASTY Left 06/28/2020   Procedure: TOTAL HIP ARTHROPLASTY ANTERIOR APPROACH;  Surgeon: Ernie Cough, MD;  Location: [facility] ORS;  Service: Orthopedics;  Laterality: Left;  70 mins   TOTAL HIP ARTHROPLASTY Right 09/12/2021   Procedure: TOTAL HIP ARTHROPLASTY ANTERIOR APPROACH;  Surgeon: Ernie Cough, MD;  Location: [facility] ORS;  Service: Orthopedics;  Laterality: Right;   WISDOM TOOTH EXTRACTION  1968    Family History  Problem Relation Age of Onset   Breast cancer Mother 59   Hypertension Mother    Thyroid disease Mother    Cancer Mother        ocular melanoma   Colon polyps Father    Cancer Father        prostate   Hypertension Father    Thyroid disease Father    Heart attack Maternal Grandmother    Cancer Maternal Grandfather        blood   Stomach cancer Paternal Grandmother    Cancer Paternal Grandmother        stomach   Colon cancer Neg Hx    Crohn's disease Neg Hx    Esophageal cancer Neg Hx    Rectal cancer Neg Hx    Ulcerative colitis Neg Hx     Social History   Socioeconomic History   Marital status: Married    Spouse name: Not on file   Number of children: Not on file   Years of education: Not on file   Highest education level: Master's degree (e.g., MA, MS, MEng, MEd, MSW, MBA)  Occupational History   Not on file  Tobacco Use   Smoking status: Never   Smokeless tobacco: Never  Vaping Use   Vaping status: Never Used  Substance and Sexual Activity   Alcohol  use: No    Alcohol /week: 0.0 standard drinks of alcohol    Drug use: No   Sexual activity: Yes    Partners: Male    Birth control/protection: Post-menopausal   Other Topics Concern   Not on file  Social History Narrative   Lives with husband.  One daughter and one grand.     Social Drivers of Corporate Investment Banker Strain: Low Risk  (08/30/2024)   Overall Financial Resource Strain (CARDIA)    Difficulty of Paying Living Expenses: Not hard at all  Food Insecurity: No Food Insecurity (08/30/2024)   Hunger Vital Sign    Worried About Running Out of Food in the Last Year: Never true    Ran Out of Food in the Last Year: Never true  Transportation  Needs: No Transportation Needs (08/30/2024)   PRAPARE - Administrator, Civil Service (Medical): No    Lack of Transportation (Non-Medical): No  Physical Activity: Sufficiently Active (08/30/2024)   Exercise Vital Sign    Days of Exercise per Week: 7 days    Minutes of Exercise per Session: 30 min  Stress: Stress Concern Present (08/30/2024)   [employer] of Occupational Health - Occupational Stress Questionnaire    Feeling of Stress: To some extent  Social Connections: Moderately Isolated (08/30/2024)   Social Connection and Isolation Panel    Frequency of Communication with Friends and Family: Twice a week    Frequency of Social Gatherings with Friends and Family: Twice a week    Attends Religious Services: Patient declined    Database Administrator or Organizations: No    Attends Engineer, Structural: Not on file    Marital Status: Married  Catering Manager Violence: Not on file    ROS: see HPI      Objective    BP (!) 142/92   Pulse 80   Temp 97.8 F (36.6 C) (Temporal)   Ht 5' 4 (1.626 m)   Wt 147 lb 6 oz (66.8 kg)   LMP 10/22/1998   SpO2 98%   BMI 25.30 kg/m   Physical Exam Vitals reviewed.  Constitutional:      General: She is not in acute distress.    Appearance: Normal appearance.  HENT:     Head: Normocephalic and atraumatic.  Cardiovascular:     Rate and Rhythm: Normal rate and regular rhythm.     Pulses: Normal pulses.     Heart  sounds: Normal heart sounds.  Pulmonary:     Effort: Pulmonary effort is normal.     Breath sounds: Normal breath sounds.  Skin:    General: Skin is warm and dry.  Neurological:     General: No focal deficit present.     Mental Status: She is alert and oriented to person, place, and time.  Psychiatric:        Mood and Affect: Mood normal.        Behavior: Behavior normal.        Judgment: Judgment normal.         Assessment & Plan:   Problem List Items Addressed This Visit       Cardiovascular and Mediastinum   Hypertension - Primary   Essential hypertension Blood pressure slightly elevated. No symptoms reported. - Continue amlodipine 7.5mg /day, refill sent to pharmacy. - Recheck 142/92 - Patient to monitor BPs at home and reach out via mychart if it is consistently running >140/>90      Relevant Medications   amLODipine (NORVASC) 2.5 MG tablet     Musculoskeletal and Integument   OP (osteoporosis)   Osteoporosis on Prolia  therapy Osteoporosis managed with Prolia . Previous Fosamax  discontinued due to joint issues. Last DEXA in March 2024, follow-up DEXA scan scheduled for early 2025. - Continue Prolia  therapy. - Managed by rheumatologist        Other   Encounter for screening for cardiovascular disorders   Screening for cardiovascular disease Intermittent palpitations, resolving quickly. No associated symptoms. - Ordered calcium score CT scan to assess heart health and risk of heart attack or stroke. - Advised to report new or worsening symptoms such as prolonged palpitations, chest pain, or shortness of breath.      Relevant Orders   CT CARDIAC SCORING (SELF PAY ONLY)   Family  history of MI (myocardial infarction)   Screening for cardiovascular disease Intermittent palpitations, resolving quickly. No associated symptoms. - Ordered calcium score CT scan to assess heart health and risk of heart attack or stroke. - Advised to report new or worsening symptoms  such as prolonged palpitations, chest pain, or shortness of breath.      Relevant Orders   CT CARDIAC SCORING (SELF PAY ONLY)   Need for vaccination   Immunizations - Discussed administration of Prevnar 20 with clinical pharmacist. No contraindication r/t to her being on Rinvoq. Patient had negative side effect to Shingrix, but does not have true allergy. Prevnar 20 administered, VIS provided.       Relevant Orders   Pneumococcal conjugate vaccine 20-valent (Prevnar 20) (Completed)   Assessment and Plan Assessment & Plan Essential hypertension Blood pressure slightly elevated. No symptoms reported. - Continue amlodipine 7.5mg /day, refill sent to pharmacy. - Recheck 142/92 - Patient to monitor BPs at home and reach out via mychart if it is consistently running >140/>90  Osteoporosis on Prolia  therapy Osteoporosis managed with Prolia . Previous Fosamax  discontinued due to joint issues. Last DEXA in March 2024, follow-up DEXA scan scheduled for early 2025. - Continue Prolia  therapy. - Managed by rheumatologist  Rheumatoid arthritis on immunosuppressive therapy Rheumatoid arthritis managed with methotrexate and Rinvoq. Under rheumatologist Dr. Jon Learn. No current symptoms of concern. - Continue methotrexate and Rinvoq therapy. - Requested labs from Dr. Jon Learn for review.  Screening for cardiovascular disease Intermittent palpitations, resolving quickly. No associated symptoms. - Ordered calcium score CT scan to assess heart health and risk of heart attack or stroke. - Advised to report new or worsening symptoms such as prolonged palpitations, chest pain, or shortness of breath.  Immunizations - Discussed administration of Prevnar 20 with clinical pharmacist. No contraindication r/t to her being on Rinvoq. Patient had negative side effect to Shingrix, but does not have true allergy. Prevnar 20 administered, VIS provided.      Return in about 4 months (around 01/01/2025)  for CPE with Lonzy Mato.   Lauraine FORBES Pereyra, NP

## 2024-09-03 NOTE — Assessment & Plan Note (Signed)
 Essential hypertension Blood pressure slightly elevated. No symptoms reported. - Continue amlodipine 7.5mg /day, refill sent to pharmacy. - Recheck 142/92 - Patient to monitor BPs at home and reach out via mychart if it is consistently running >140/>90

## 2024-09-03 NOTE — Assessment & Plan Note (Signed)
 Osteoporosis on Prolia  therapy Osteoporosis managed with Prolia . Previous Fosamax  discontinued due to joint issues. Last DEXA in March 2024, follow-up DEXA scan scheduled for early 2025. - Continue Prolia  therapy. - Managed by rheumatologist

## 2024-09-07 LAB — GENECONNECT MOLECULAR SCREEN: Genetic Analysis Overall Interpretation: NEGATIVE

## 2024-09-23 ENCOUNTER — Ambulatory Visit (HOSPITAL_BASED_OUTPATIENT_CLINIC_OR_DEPARTMENT_OTHER)
Admission: RE | Admit: 2024-09-23 | Discharge: 2024-09-23 | Disposition: A | Payer: Self-pay | Source: Ambulatory Visit | Attending: Nurse Practitioner | Admitting: Nurse Practitioner

## 2024-09-23 DIAGNOSIS — Z136 Encounter for screening for cardiovascular disorders: Secondary | ICD-10-CM | POA: Insufficient documentation

## 2024-09-23 DIAGNOSIS — Z8249 Family history of ischemic heart disease and other diseases of the circulatory system: Secondary | ICD-10-CM | POA: Insufficient documentation

## 2024-10-01 ENCOUNTER — Ambulatory Visit: Payer: Self-pay | Admitting: Nurse Practitioner

## 2024-10-30 ENCOUNTER — Telehealth: Admitting: Nurse Practitioner

## 2024-10-30 DIAGNOSIS — I2510 Atherosclerotic heart disease of native coronary artery without angina pectoris: Secondary | ICD-10-CM | POA: Insufficient documentation

## 2024-10-30 MED ORDER — PRAVASTATIN SODIUM 10 MG PO TABS: 10.0000 mg | ORAL_TABLET | Freq: Every day | ORAL | 1 refills | Status: AC

## 2024-10-30 NOTE — Assessment & Plan Note (Signed)
 Atherosclerotic heart disease of native coronary artery without angina Calcium CT score below 50th percentile for age. Family history of cardiovascular events. Cholesterol levels not significantly elevated. Pravastatin  chosen for plaque stabilization with lower risk of muscle aches. - Prescribed pravastatin  10mg /day, one tablet by mouth daily at bedtime. - Monitor for muscle aches and report adverse effects. - Schedule follow-up blood work in 8 weeks for liver enzymes and kidney function. - Coordinate with rheumatologist to include lipid panel in upcoming blood work.

## 2024-10-30 NOTE — Progress Notes (Signed)
 "  Established Patient Office Visit  An audio/visual tele-health visit was completed today for this patient. I connected with  Jacqueline Tucker on 10/30/2024 utilizing audio/visual technology and verified that I am speaking with the correct person using two identifiers. The patient was located at their home, and I was located at the office of [facility] at [facility] during the encounter. I discussed the limitations of evaluation and management by telemedicine. The patient expressed understanding and agreed to proceed.     Subjective   Patient ID: Jacqueline Tucker, female    DOB: [birth year]  Age: 74 y.o. MRN: [id number]  Chief Complaint  Patient presents with   Medical Management of Chronic Issues    Pt wanting to discuss more about her CT score results/ treatment      Discussed the use of AI scribe software for clinical note transcription with the patient, who gave verbal consent to proceed.  History of Present Illness Jacqueline Tucker is a 74 year old female who presents for follow-up on her calcium CT score.  Coronary artery calcification - Coronary calcium CT demonstrated mild calcification with a score of 17, below the 50th percentile for women her age. - Family history of myocardial infarction and cerebrovascular accident prompted the scan. - She is actively working on dietary modifications for cardiovascular risk reduction.      ROS: see HPI    Objective:     LMP 10/22/1998    Physical Exam Comprehensive physical exam not completed today as office visit was conducted remotely.  No evidence of acute distress seen on video.  Patient was alert and oriented, and appeared to have appropriate judgment.   No results found for any visits on 10/30/24.  Last lipids Lab Results  Component Value Date   CHOL 152 01/12/2016   HDL 71 01/12/2016   LDLCALC 68 01/12/2016   TRIG 67 01/12/2016   CHOLHDL 2.1 01/12/2016     The ASCVD Risk score (Arnett DK, et al., 2019)  failed to calculate for the following reasons:   Cannot find a previous HDL lab   Cannot find a previous total cholesterol lab   * - Cholesterol units were assumed    Assessment & Plan:   Problem List Items Addressed This Visit       Cardiovascular and Mediastinum   Coronary artery disease involving native coronary artery of native heart without angina pectoris - Primary   Atherosclerotic heart disease of native coronary artery without angina Calcium CT score below 50th percentile for age. Family history of cardiovascular events. Cholesterol levels not significantly elevated. Pravastatin  chosen for plaque stabilization with lower risk of muscle aches. - Prescribed pravastatin  10mg /day, one tablet by mouth daily at bedtime. - Monitor for muscle aches and report adverse effects. - Schedule follow-up blood work in 8 weeks for liver enzymes and kidney function. - Coordinate with rheumatologist to include lipid panel in upcoming blood work.      Relevant Medications   pravastatin  (PRAVACHOL ) 10 MG tablet   Assessment and Plan Assessment & Plan Atherosclerotic heart disease of native coronary artery without angina Calcium CT score below 50th percentile for age. Family history of cardiovascular events. Cholesterol levels not significantly elevated. Pravastatin  chosen for plaque stabilization with lower risk of muscle aches. - Prescribed pravastatin  10mg /day, one tablet by mouth daily at bedtime. - Monitor for muscle aches and report adverse effects. - Schedule follow-up blood work in 8 weeks for liver enzymes and kidney function. - Coordinate  with rheumatologist to include lipid panel in upcoming blood work.   Return in about 8 weeks (around 12/25/2024) for F/U with Jamil Armwood.    Jacqueline FORBES Pereyra, NP  "

## 2024-11-16 ENCOUNTER — Telehealth (HOSPITAL_COMMUNITY): Payer: Self-pay | Admitting: Pharmacy Technician

## 2024-11-16 ENCOUNTER — Telehealth (HOSPITAL_COMMUNITY): Payer: Self-pay | Admitting: Pharmacist

## 2024-11-16 NOTE — Telephone Encounter (Signed)
 Auth Submission: PENDING Site of care: [facility] Payer: HUMANA MEDICARE Medication & CPT/J Code(s) submitted: Stoboclo (denosumab -bmwo) Q5157 Diagnosis Code: M81.0 Route of submission (phone, fax, portal): CMM KEY: [id number] Phone # Fax # Auth type: Buy/Bill HB Units/visits requested: 60mg  x 2 doses, q 6 months Reference number: [id number] Approval from: *** to ***   _______________________________________________________   Shara Submission: PENDING Site of care: [facility] Payer: HUMANA MEDICARE Medication & CPT/J Code(s) submitted: Prolia  (Denosumab ) G9102 Diagnosis Code: M81.0 Route of submission (phone, fax, portal): CMM KEY: [id number] Phone # Fax # Auth type: Buy/Bill HB Units/visits requested: 60mg  x 2 doses, q 6 months Reference number: [id number] Approval from: *** to ***    Dagoberto Armour, CPhT [facility] Phone: [phone] 11/16/2024

## 2024-11-16 NOTE — Telephone Encounter (Signed)
 Patient aware of site of care change to [facility] for denosumab   Need updated denosumab  orders. Faxed to [facility]  Fax: [phone] Phone: [phone]

## 2024-11-19 ENCOUNTER — Encounter: Payer: Self-pay | Admitting: Nurse Practitioner

## 2024-11-19 ENCOUNTER — Encounter (HOSPITAL_COMMUNITY): Payer: Self-pay | Admitting: Rheumatology

## 2024-11-19 LAB — HM MAMMOGRAPHY

## 2024-11-20 ENCOUNTER — Encounter: Payer: Self-pay | Admitting: Nurse Practitioner

## 2024-11-25 ENCOUNTER — Encounter: Payer: Self-pay | Admitting: Nurse Practitioner

## 2025-01-07 ENCOUNTER — Encounter: Admitting: Nurse Practitioner

## 2025-01-14 ENCOUNTER — Ambulatory Visit: Admitting: Nurse Practitioner

## 2025-01-22 ENCOUNTER — Encounter (HOSPITAL_COMMUNITY)

## 2025-01-22 ENCOUNTER — Ambulatory Visit

## 2025-01-29 ENCOUNTER — Encounter (HOSPITAL_COMMUNITY)
